# Patient Record
Sex: Female | Born: 1955 | Race: White | Hispanic: No | Marital: Married | Smoking: Never smoker
Health system: Southern US, Community
[De-identification: ages and names within clinical notes are randomized; demographics above are authoritative.]

## PROBLEM LIST (undated history)

## (undated) DIAGNOSIS — D1771 Benign lipomatous neoplasm of kidney: Secondary | ICD-10-CM

## (undated) DIAGNOSIS — F32A Depression, unspecified: Secondary | ICD-10-CM

## (undated) DIAGNOSIS — G4730 Sleep apnea, unspecified: Secondary | ICD-10-CM

## (undated) DIAGNOSIS — F419 Anxiety disorder, unspecified: Secondary | ICD-10-CM

## (undated) DIAGNOSIS — E039 Hypothyroidism, unspecified: Secondary | ICD-10-CM

## (undated) DIAGNOSIS — T753XXA Motion sickness, initial encounter: Secondary | ICD-10-CM

## (undated) DIAGNOSIS — I341 Nonrheumatic mitral (valve) prolapse: Secondary | ICD-10-CM

## (undated) DIAGNOSIS — D3001 Benign neoplasm of right kidney: Secondary | ICD-10-CM

## (undated) HISTORY — PX: BREAST CYST ASPIRATION: SHX578

## (undated) HISTORY — PX: TONSILLECTOMY: SUR1361

---

## 1986-12-11 HISTORY — PX: TUBAL LIGATION: SHX77

## 2004-12-11 HISTORY — PX: APPENDECTOMY: SHX54

## 2006-05-28 ENCOUNTER — Ambulatory Visit: Payer: Self-pay | Admitting: General Surgery

## 2007-02-20 ENCOUNTER — Ambulatory Visit: Payer: Self-pay | Admitting: Gastroenterology

## 2007-03-07 ENCOUNTER — Ambulatory Visit: Payer: Self-pay

## 2008-09-04 ENCOUNTER — Ambulatory Visit: Payer: Self-pay | Admitting: Family Medicine

## 2009-03-23 ENCOUNTER — Ambulatory Visit: Payer: Self-pay | Admitting: Obstetrics & Gynecology

## 2009-11-24 ENCOUNTER — Ambulatory Visit: Payer: Self-pay | Admitting: Family Medicine

## 2011-03-15 ENCOUNTER — Ambulatory Visit: Payer: Self-pay

## 2012-04-06 ENCOUNTER — Emergency Department: Payer: Self-pay | Admitting: Internal Medicine

## 2012-08-20 ENCOUNTER — Ambulatory Visit: Payer: Self-pay | Admitting: Family Medicine

## 2014-08-06 ENCOUNTER — Ambulatory Visit: Payer: Self-pay | Admitting: Family Medicine

## 2015-04-12 ENCOUNTER — Ambulatory Visit
Admission: RE | Admit: 2015-04-12 | Discharge: 2015-04-12 | Disposition: A | Discharge status: Home / Self Care | Payer: BLUE CROSS/BLUE SHIELD | Source: Ambulatory Visit | Attending: *Deleted | Admitting: *Deleted

## 2015-04-12 ENCOUNTER — Other Ambulatory Visit: Payer: Self-pay | Admitting: *Deleted

## 2015-04-12 ENCOUNTER — Ambulatory Visit
Admission: RE | Admit: 2015-04-12 | Discharge: 2015-04-12 | Disposition: A | Discharge status: Home / Self Care | Payer: BLUE CROSS/BLUE SHIELD | Attending: Family Medicine | Admitting: Family Medicine

## 2015-04-12 DIAGNOSIS — R1013 Epigastric pain: Secondary | ICD-10-CM | POA: Insufficient documentation

## 2015-04-13 ENCOUNTER — Other Ambulatory Visit: Payer: Self-pay | Admitting: Family Medicine

## 2015-04-13 DIAGNOSIS — R1013 Epigastric pain: Secondary | ICD-10-CM

## 2015-04-14 ENCOUNTER — Ambulatory Visit
Admission: RE | Admit: 2015-04-14 | Discharge: 2015-04-14 | Disposition: A | Discharge status: Home / Self Care | Payer: BLUE CROSS/BLUE SHIELD | Source: Ambulatory Visit | Attending: Family Medicine | Admitting: Family Medicine

## 2015-04-14 ENCOUNTER — Ambulatory Visit: Payer: BLUE CROSS/BLUE SHIELD

## 2015-04-14 DIAGNOSIS — R109 Unspecified abdominal pain: Secondary | ICD-10-CM | POA: Diagnosis present

## 2015-04-14 DIAGNOSIS — R1013 Epigastric pain: Secondary | ICD-10-CM

## 2015-04-14 DIAGNOSIS — N289 Disorder of kidney and ureter, unspecified: Secondary | ICD-10-CM | POA: Diagnosis not present

## 2015-04-16 ENCOUNTER — Other Ambulatory Visit: Payer: Self-pay | Admitting: Family Medicine

## 2015-04-16 DIAGNOSIS — R9389 Abnormal findings on diagnostic imaging of other specified body structures: Secondary | ICD-10-CM

## 2015-04-23 ENCOUNTER — Ambulatory Visit: Payer: BLUE CROSS/BLUE SHIELD

## 2015-05-04 ENCOUNTER — Encounter: Payer: Self-pay | Admitting: *Deleted

## 2015-05-05 NOTE — Discharge Instructions (Signed)

## 2015-05-06 ENCOUNTER — Ambulatory Visit
Admission: RE | Admit: 2015-05-06 | Discharge: 2015-05-06 | Disposition: A | Discharge status: Home / Self Care | Payer: BLUE CROSS/BLUE SHIELD | Source: Ambulatory Visit | Attending: Gastroenterology | Admitting: Gastroenterology

## 2015-05-06 ENCOUNTER — Ambulatory Visit: Payer: BLUE CROSS/BLUE SHIELD

## 2015-05-06 ENCOUNTER — Other Ambulatory Visit: Payer: Self-pay | Admitting: Gastroenterology

## 2015-05-06 ENCOUNTER — Encounter
Admission: RE | Disposition: A | Discharge status: Home / Self Care | Payer: Self-pay | Source: Ambulatory Visit | Attending: Gastroenterology

## 2015-05-06 DIAGNOSIS — R634 Abnormal weight loss: Secondary | ICD-10-CM | POA: Insufficient documentation

## 2015-05-06 DIAGNOSIS — Z6829 Body mass index (BMI) 29.0-29.9, adult: Secondary | ICD-10-CM | POA: Diagnosis not present

## 2015-05-06 DIAGNOSIS — I341 Nonrheumatic mitral (valve) prolapse: Secondary | ICD-10-CM | POA: Diagnosis not present

## 2015-05-06 DIAGNOSIS — Z79899 Other long term (current) drug therapy: Secondary | ICD-10-CM | POA: Diagnosis not present

## 2015-05-06 DIAGNOSIS — E039 Hypothyroidism, unspecified: Secondary | ICD-10-CM | POA: Diagnosis not present

## 2015-05-06 DIAGNOSIS — K648 Other hemorrhoids: Secondary | ICD-10-CM | POA: Insufficient documentation

## 2015-05-06 DIAGNOSIS — F329 Major depressive disorder, single episode, unspecified: Secondary | ICD-10-CM | POA: Diagnosis not present

## 2015-05-06 DIAGNOSIS — F419 Anxiety disorder, unspecified: Secondary | ICD-10-CM | POA: Diagnosis not present

## 2015-05-06 DIAGNOSIS — K219 Gastro-esophageal reflux disease without esophagitis: Secondary | ICD-10-CM | POA: Insufficient documentation

## 2015-05-06 DIAGNOSIS — R1013 Epigastric pain: Secondary | ICD-10-CM | POA: Diagnosis not present

## 2015-05-06 HISTORY — DX: Benign lipomatous neoplasm of kidney: D17.71

## 2015-05-06 HISTORY — PX: ESOPHAGOGASTRODUODENOSCOPY: SHX5428

## 2015-05-06 HISTORY — DX: Nonrheumatic mitral (valve) prolapse: I34.1

## 2015-05-06 HISTORY — DX: Benign neoplasm of right kidney: D30.01

## 2015-05-06 HISTORY — DX: Motion sickness, initial encounter: T75.3XXA

## 2015-05-06 HISTORY — PX: COLONOSCOPY: SHX5424

## 2015-05-06 LAB — HM COLONOSCOPY

## 2015-05-06 SURGERY — COLONOSCOPY
Anesthesia: Monitor Anesthesia Care | Wound class: Contaminated

## 2015-05-06 MED ORDER — LACTATED RINGERS IV SOLN
INTRAVENOUS | Status: DC
  Administered 2015-05-06 (×2): via INTRAVENOUS

## 2015-05-06 MED ORDER — PROPOFOL 10 MG/ML IV BOLUS
INTRAVENOUS | Status: DC | PRN
  Administered 2015-05-06: 50 mg via INTRAVENOUS
  Administered 2015-05-06: 100 mg via INTRAVENOUS
  Administered 2015-05-06: 20 mg via INTRAVENOUS
  Administered 2015-05-06 (×2): 50 mg via INTRAVENOUS

## 2015-05-06 MED ORDER — GLYCOPYRROLATE 0.2 MG/ML IJ SOLN
INTRAMUSCULAR | Status: DC | PRN
  Administered 2015-05-06: 0.2 mg via INTRAVENOUS

## 2015-05-06 MED ORDER — STERILE WATER FOR IRRIGATION IR SOLN
Status: DC | PRN
  Administered 2015-05-06: 11:00:00

## 2015-05-06 SURGICAL SUPPLY — 38 items
BALLN DILATOR 10-12 8 (BALLOONS)
BALLN DILATOR 12-15 8 (BALLOONS)
BALLN DILATOR 15-18 8 (BALLOONS)
BALLN DILATOR CRE 0-12 8 (BALLOONS)
BALLN DILATOR ESOPH 8 10 CRE (MISCELLANEOUS) IMPLANT
BALLOON DILATOR 12-15 8 (BALLOONS) IMPLANT
BALLOON DILATOR 15-18 8 (BALLOONS) IMPLANT
BALLOON DILATOR CRE 0-12 8 (BALLOONS) IMPLANT
BLOCK BITE 60FR ADLT L/F GRN (MISCELLANEOUS) ×3 IMPLANT
CANISTER SUCT 1200ML W/VALVE (MISCELLANEOUS) ×3 IMPLANT
FCP ESCP3.2XJMB 240X2.8X (MISCELLANEOUS)
FORCEPS BIOP RAD 4 LRG CAP 4 (CUTTING FORCEPS) ×3 IMPLANT
FORCEPS BIOP RJ4 240 W/NDL (MISCELLANEOUS)
FORCEPS ESCP3.2XJMB 240X2.8X (MISCELLANEOUS) IMPLANT
GOWN CVR UNV OPN BCK APRN NK (MISCELLANEOUS) ×2 IMPLANT
GOWN ISOL THUMB LOOP REG UNIV (MISCELLANEOUS) ×4
HEMOCLIP INSTINCT (CLIP) IMPLANT
INJECTOR VARIJECT VIN23 (MISCELLANEOUS) IMPLANT
KIT CO2 TUBING (TUBING) ×3 IMPLANT
KIT DEFENDO VALVE AND CONN (KITS) IMPLANT
KIT ENDO PROCEDURE OLY (KITS) ×3 IMPLANT
LIGATOR MULTIBAND 6SHOOTER MBL (MISCELLANEOUS) IMPLANT
MARKER SPOT ENDO TATTOO 5ML (MISCELLANEOUS) IMPLANT
PAD GROUND ADULT SPLIT (MISCELLANEOUS) IMPLANT
SNARE SHORT THROW 13M SML OVAL (MISCELLANEOUS) IMPLANT
SNARE SHORT THROW 30M LRG OVAL (MISCELLANEOUS) IMPLANT
SPOT EX ENDOSCOPIC TATTOO (MISCELLANEOUS)
SUCTION POLY TRAP 4CHAMBER (MISCELLANEOUS) IMPLANT
SYR INFLATION 60ML (SYRINGE) IMPLANT
TRAP SUCTION POLY (MISCELLANEOUS) IMPLANT
TUBING CONN 6MMX3.1M (TUBING)
TUBING SUCTION CONN 0.25 STRL (TUBING) IMPLANT
UNDERPAD 30X60 958B10 (PK) (MISCELLANEOUS) IMPLANT
VALVE BIOPSY ENDO (VALVE) IMPLANT
VARIJECT INJECTOR VIN23 (MISCELLANEOUS)
WATER AUXILLARY (MISCELLANEOUS) IMPLANT
WATER STERILE IRR 500ML POUR (IV SOLUTION) ×3 IMPLANT
WIRE CRE 18-20MM 8CM F G (MISCELLANEOUS) IMPLANT

## 2015-05-06 NOTE — Op Note (Signed)
[facility] Patient Name: Barbara Underwood Procedure Date: 05/06/2015 11:05 AM MRN: [id number] Account #: [id number] Date of Birth: 1956-01-16 Admit Type: Outpatient Age: 59 Room: [facility] OR ROOM 01 Gender: Female Note Status: Finalized Procedure:         Colonoscopy Indications:       Epigastric abdominal pain, Weight loss Providers:         Lucilla Lame, MD Referring MD:      Kirstie Peri. Caryn Section, MD (Referring MD) Medicines:         Propofol per Anesthesia Complications:     No immediate complications. Procedure:         Pre-Anesthesia Assessment:                    - Prior to the procedure, a History and Physical was                     performed, and patient medications and allergies were                     reviewed. The patient's tolerance of previous anesthesia                     was also reviewed. The risks and benefits of the procedure                     and the sedation options and risks were discussed with the                     patient. All questions were answered, and informed consent                     was obtained. Prior Anticoagulants: The patient has taken                     no previous anticoagulant or antiplatelet agents. ASA                     Grade Assessment: II - A patient with mild systemic                     disease. After reviewing the risks and benefits, the                     patient was deemed in satisfactory condition to undergo                     the procedure.                    After obtaining informed consent, the colonoscope was                     passed under direct vision. Throughout the procedure, the                     patient's blood pressure, pulse, and oxygen saturations                     were monitored continuously. The Olympus PCF H180AL                     colonoscope (S#: [id number]) was introduced through the anus  and advanced to the the cecum, identified by appendiceal            orifice and ileocecal valve. The colonoscopy was performed                     without difficulty. The patient tolerated the procedure                     well. The quality of the bowel preparation was excellent. Findings:      The perianal and digital rectal examinations were normal.      Non-bleeding internal hemorrhoids were found during retroflexion. The       hemorrhoids were Grade I (internal hemorrhoids that do not prolapse). Impression:        - Non-bleeding internal hemorrhoids.                    - No specimens collected. Recommendation:    - Repeat colonoscopy in 10 years for screening unless any                     change in family history or lower GI problems. Procedure Code(s): --- Professional ---                    [phone], Colonoscopy, flexible; diagnostic, including                     collection of specimen(s) by brushing or washing, when                     performed (separate procedure) Diagnosis Code(s): --- Professional ---                    R10.13, Epigastric pain                    R63.4, Abnormal weight loss CPT copyright 2014 American Medical Association. All rights reserved. The codes documented in this report are preliminary and upon coder review may  be revised to meet current compliance requirements. Lucilla Lame, MD 05/06/2015 11:30:11 AM This report has been signed electronically. Number of Addenda: 0 Note Initiated On: 05/06/2015 11:05 AM Scope Withdrawal Time: 0 hours 7 minutes 30 seconds  Total Procedure Duration: 0 hours 10 minutes 6 seconds       [facility]

## 2015-05-06 NOTE — Op Note (Signed)
[facility] [place] Patient Name: Oliver Neuwirth Procedure Date: 05/06/2015 11:06 AM MRN: [id number] Account #: [id number] Date of Birth: [birth year] Admit Type: Outpatient Age: 59 Room: [facility] OR ROOM 01 Gender: Female Note Status: Finalized Procedure:         Upper GI endoscopy Indications:       Epigastric abdominal pain, Weight loss Providers:         Lucilla Lame, MD Referring MD:      Kirstie Peri. Caryn Section, MD (Referring MD) Medicines:         Propofol per Anesthesia Complications:     No immediate complications. Procedure:         Pre-Anesthesia Assessment:                    - Prior to the procedure, a History and Physical was                     performed, and patient medications and allergies were                     reviewed. The patient's tolerance of previous anesthesia                     was also reviewed. The risks and benefits of the procedure                     and the sedation options and risks were discussed with the                     patient. All questions were answered, and informed consent                     was obtained. Prior Anticoagulants: The patient has taken                     no previous anticoagulant or antiplatelet agents. ASA                     Grade Assessment: II - A patient with mild systemic                     disease. After reviewing the risks and benefits, the                     patient was deemed in satisfactory condition to undergo                     the procedure.                    After obtaining informed consent, the endoscope was passed                     under direct vision. Throughout the procedure, the                     patient's blood pressure, pulse, and oxygen saturations                     were monitored continuously. The Olympus GIF H180J                     endoscope (S#: [id number]) was introduced through the mouth,  and advanced to the second part of duodenum. The upper GI                 endoscopy was accomplished without difficulty. The patient                     tolerated the procedure well. Findings:      The examined esophagus was normal.      The entire examined stomach was normal. Biopsies were taken with a cold       forceps for histology.      The examined duodenum was normal. Impression:        - Normal esophagus.                    - Normal stomach. Biopsied.                    - Normal examined duodenum. Recommendation:    - Await pathology results. Procedure Code(s): --- Professional ---                    [id number], Esophagogastroduodenoscopy, flexible, transoral;                     with biopsy, single or multiple Diagnosis Code(s): --- Professional ---                    R10.13, Epigastric pain                    R63.4, Abnormal weight loss CPT copyright 2014 American Medical Association. All rights reserved. The codes documented in this report are preliminary and upon coder review may  be revised to meet current compliance requirements. Lucilla Lame, MD 05/06/2015 11:16:32 AM This report has been signed electronically. Number of Addenda: 0 Note Initiated On: 05/06/2015 11:06 AM Total Procedure Duration: 0 hours 3 minutes 53 seconds       [facility]

## 2015-05-06 NOTE — H&P (Signed)
[facility]  [address], [place], [place] [address] Phone: [phone] Fax : [phone]  Primary Care Physician:  Lelon Huh, MD Primary Gastroenterologist:  Dr. Allen Norris  Pre-Procedure History & Physical: HPI:  Barbara Underwood is a 59 y.o. female is here for an endoscopy and colonoscopy.   Past Medical History  Diagnosis Date  . Mitral valve prolapse     Told she had as child, now told VERY mild, no need for prophylaxis  . Angiomyolipoma of right kidney     stable  . Motion sickness     car rides    Past Surgical History  Procedure Laterality Date  . Tubal ligation  1988  . Appendectomy  2006    Dr Fleet Contras, [facility]    Prior to Admission medications   Medication Sig Start Date End Date Taking? Authorizing Provider  buPROPion (WELLBUTRIN XL) 150 MG 24 hr tablet Take 150 mg by mouth daily. AM   Yes Historical Provider, MD  Coenzyme Q10 (CO Q 10 PO) Take by mouth.   Yes Historical Provider, MD  hyoscyamine (LEVSIN SL) 0.125 MG SL tablet Place 0.125 mg under the tongue every 6 (six) hours as needed.   Yes Historical Provider, MD  levothyroxine (SYNTHROID, LEVOTHROID) 75 MCG tablet Take 75 mcg by mouth daily before breakfast.   Yes Historical Provider, MD  LORazepam (ATIVAN) 0.5 MG tablet Take 0.5 mg by mouth every 4 (four) hours as needed for anxiety.   Yes Historical Provider, MD  Multiple Vitamin (MULTIVITAMIN) capsule Take 1 capsule by mouth daily.   Yes Historical Provider, MD  omeprazole (PRILOSEC) 20 MG capsule Take 20 mg by mouth daily. AM   Yes Historical Provider, MD  ondansetron (ZOFRAN) 4 MG tablet Take 4 mg by mouth every 6 (six) hours as needed for nausea or vomiting.   Yes Historical Provider, MD  sucralfate (CARAFATE) 1 GM/10ML suspension Take 1 g by mouth 4 (four) times daily.   Yes Historical Provider, MD  traZODone (DESYREL) 50 MG tablet Take 50 mg by mouth at bedtime.   Yes Historical Provider, MD    Allergies as of 05/04/2015 - Review  Complete 05/04/2015  Allergen Reaction Noted  . Codeine Nausea And Vomiting 05/04/2015    History reviewed. No pertinent family history.  History   Social History  . Marital Status: Married    Spouse Name: N/A  . Number of Children: N/A  . Years of Education: N/A   Occupational History  . Not on file.   Social History Main Topics  . Smoking status: Never Smoker   . Smokeless tobacco: Not on file  . Alcohol Use: No  . Drug Use: Not on file  . Sexual Activity: Not on file   Other Topics Concern  . Not on file   Social History Narrative    Review of Systems: See HPI, otherwise negative ROS  Physical Exam: BP 127/63 mmHg  Pulse 70  Temp(Src) 97.9 F (36.6 C) (Temporal)  Resp 16  Ht 5\' 5"  (1.651 m)  Wt 175 lb (79.379 kg)  BMI 29.12 kg/m2  SpO2 100% General:   Alert,  pleasant and cooperative in NAD Head:  Normocephalic and atraumatic. Neck:  Supple; no masses or thyromegaly. Lungs:  Clear throughout to auscultation.    Heart:  Regular rate and rhythm. Abdomen:  Soft, nontender and nondistended. Normal bowel sounds, without guarding, and without rebound.   Neurologic:  Alert and  oriented x4;  grossly normal neurologically.  Impression/Plan: Barbara Critchley  Underwood is here for an endoscopy and colonoscopy to be performed for Epigastric pain and weight loss  Risks, benefits, limitations, and alternatives regarding  endoscopy and colonoscopy have been reviewed with the patient.  Questions have been answered.  All parties agreeable.   [facility], MD  05/06/2015, 11:02 AM

## 2015-05-06 NOTE — Anesthesia Preprocedure Evaluation (Signed)
Anesthesia Evaluation  Patient identified by MRN, date of birth, ID band Patient awake    Reviewed: Allergy & Precautions, NPO status , Patient's Chart, lab work & pertinent test results  Airway Mallampati: II  TM Distance: >3 FB Neck ROM: Full    Dental   Pulmonary    Pulmonary exam normal       Cardiovascular Normal cardiovascular exam    Neuro/Psych Anxiety Depression    GI/Hepatic GERD-  ,  Endo/Other  Hypothyroidism   Renal/GU Renal disease     Musculoskeletal   Abdominal   Peds  Hematology   Anesthesia Other Findings   Reproductive/Obstetrics                             Anesthesia Physical Anesthesia Plan  ASA: II  Anesthesia Plan: MAC   Post-op Pain Management:    Induction: Intravenous  Airway Management Planned: Nasal Cannula  Additional Equipment:   Intra-op Plan:   Post-operative Plan:   Informed Consent: I have reviewed the patients History and Physical, chart, labs and discussed the procedure including the risks, benefits and alternatives for the proposed anesthesia with the patient or authorized representative who has indicated his/her understanding and acceptance.     Plan Discussed with: CRNA  Anesthesia Plan Comments:         Anesthesia Quick Evaluation

## 2015-05-06 NOTE — Anesthesia Postprocedure Evaluation (Signed)
  Anesthesia Post-op Note  Patient: Barbara Underwood  Procedure(s) Performed: Procedure(s): COLONOSCOPY (N/A) ESOPHAGOGASTRODUODENOSCOPY (EGD) (N/A)  Anesthesia type:MAC  Patient location: PACU  Post pain: Pain level controlled  Post assessment: Post-op Vital signs reviewed, Patient's Cardiovascular Status Stable, Respiratory Function Stable, Patent Airway and No signs of Nausea or vomiting  Post vital signs: Reviewed and stable  Last Vitals:  Filed Vitals:   05/06/15 1145  BP: 82/66  Pulse: 80  Temp:   Resp: 16    Level of consciousness: awake, alert  and patient cooperative  Complications: No apparent anesthesia complications

## 2015-05-06 NOTE — Transfer of Care (Signed)
Immediate Anesthesia Transfer of Care Note  Patient: Barbara Underwood  Procedure(s) Performed: Procedure(s): COLONOSCOPY (N/A) ESOPHAGOGASTRODUODENOSCOPY (EGD) (N/A)  Patient Location: PACU  Anesthesia Type: MAC  Level of Consciousness: awake, alert  and patient cooperative  Airway and Oxygen Therapy: Patient Spontanous Breathing and Patient connected to supplemental oxygen  Post-op Assessment: Post-op Vital signs reviewed, Patient's Cardiovascular Status Stable, Respiratory Function Stable, Patent Airway and No signs of Nausea or vomiting  Post-op Vital Signs: Reviewed and stable  Complications: No apparent anesthesia complications

## 2015-05-06 NOTE — Anesthesia Procedure Notes (Signed)
Procedure Name: MAC Performed by: Gailene Youkhana Pre-anesthesia Checklist: Patient identified, Emergency Drugs available, Suction available, Patient being monitored and Timeout performed Patient Re-evaluated:Patient Re-evaluated prior to inductionOxygen Delivery Method: Nasal cannula Preoxygenation: Pre-oxygenation with 100% oxygen       

## 2015-05-11 ENCOUNTER — Encounter: Payer: Self-pay | Admitting: Gastroenterology

## 2015-06-25 ENCOUNTER — Ambulatory Visit (INDEPENDENT_AMBULATORY_CARE_PROVIDER_SITE_OTHER): Payer: BLUE CROSS/BLUE SHIELD | Admitting: Family Medicine

## 2015-06-25 ENCOUNTER — Encounter: Payer: Self-pay | Admitting: Family Medicine

## 2015-06-25 VITALS — BP 102/64 | HR 80 | Temp 99.1°F | Resp 16

## 2015-06-25 DIAGNOSIS — J069 Acute upper respiratory infection, unspecified: Secondary | ICD-10-CM | POA: Diagnosis not present

## 2015-06-25 NOTE — Patient Instructions (Signed)
Discussed use of Mucinex D for congestion, Delsym for cough, and Benadryl for postnasal drainage at night.

## 2015-06-25 NOTE — Progress Notes (Signed)
Subjective:     Patient ID: Barbara Underwood, female   DOB: 05-29-1956, 59 y.o.   MRN: [id number]  HPI  Chief Complaint  Patient presents with  . Cough    patient comes in office today with complaints of cough and sore throat since Sunday. Patient describes cough as dry and painful, associated symptoms include cracking in ear.      Review of Systems  Constitutional: Negative for fever and chills.       Objective:   Physical Exam  Constitutional: She appears well-developed and well-nourished. No distress.  Ears: T.M's intact without inflammation Throat: no tonsillar enlargement or exudate (tonsils absent) Neck: no cervical adenopathy with mild right anterior cervical discomfort. Lungs: clear     Assessment:    1. Upper respiratory infection    Plan:    Discussed use of Mucinex D for congestion, Delsym for cough, and Benadryl for postnasal drainage. Hold trazodone at night if using Benadryl.

## 2015-06-29 ENCOUNTER — Telehealth: Payer: Self-pay | Admitting: Family Medicine

## 2015-06-29 NOTE — Telephone Encounter (Signed)
Her thyroid functions were normal and do not need to be checked again anytime soon. The only reason she was dehydrated was due to the GI problems she was having at the time. Is she is still having GI problems then it would be a good idea to recheck a renal panel. Thanks.

## 2015-06-29 NOTE — Telephone Encounter (Signed)
Patient had TSH checked on 04/30/15 and it was 2.990 (normal). Patient was also slightly dehydrated, and you  recommended  that she increase her fluid intake. Ok to recheck TSH level? And would you like renal panel as well? Please advise. Thanks!

## 2015-06-29 NOTE — Telephone Encounter (Signed)
Advised patient that she does not need to have TSH repeated. She reports that her GI symptoms have resolved. So we do not need to have labs drawn.

## 2015-06-29 NOTE — Telephone Encounter (Signed)
Pt is requesting a lab slip to have thyroid rechecked.  CB#[phone]

## 2015-06-30 ENCOUNTER — Other Ambulatory Visit: Payer: Self-pay

## 2015-06-30 DIAGNOSIS — F419 Anxiety disorder, unspecified: Secondary | ICD-10-CM

## 2015-06-30 DIAGNOSIS — D369 Benign neoplasm, unspecified site: Secondary | ICD-10-CM | POA: Insufficient documentation

## 2015-06-30 DIAGNOSIS — E039 Hypothyroidism, unspecified: Secondary | ICD-10-CM | POA: Insufficient documentation

## 2015-06-30 DIAGNOSIS — G43909 Migraine, unspecified, not intractable, without status migrainosus: Secondary | ICD-10-CM | POA: Insufficient documentation

## 2015-06-30 DIAGNOSIS — E669 Obesity, unspecified: Secondary | ICD-10-CM | POA: Insufficient documentation

## 2015-06-30 DIAGNOSIS — N2889 Other specified disorders of kidney and ureter: Secondary | ICD-10-CM | POA: Insufficient documentation

## 2015-06-30 DIAGNOSIS — I341 Nonrheumatic mitral (valve) prolapse: Secondary | ICD-10-CM | POA: Insufficient documentation

## 2015-06-30 DIAGNOSIS — F329 Major depressive disorder, single episode, unspecified: Secondary | ICD-10-CM | POA: Insufficient documentation

## 2015-07-01 ENCOUNTER — Ambulatory Visit (INDEPENDENT_AMBULATORY_CARE_PROVIDER_SITE_OTHER): Payer: BLUE CROSS/BLUE SHIELD | Admitting: Family Medicine

## 2015-07-01 ENCOUNTER — Encounter: Payer: Self-pay | Admitting: Family Medicine

## 2015-07-01 VITALS — BP 120/56 | HR 98 | Temp 98.2°F | Resp 16

## 2015-07-01 DIAGNOSIS — H6982 Other specified disorders of Eustachian tube, left ear: Secondary | ICD-10-CM

## 2015-07-01 MED ORDER — MOMETASONE FUROATE 50 MCG/ACT NA SUSP: 2.0000 | Freq: Every day | NASAL | Status: DC

## 2015-07-01 NOTE — Progress Notes (Signed)
Subjective:     Patient ID: Barbara Underwood, female   DOB: 05/23/1956, 59 y.o.   MRN: [id number]  HPI  Chief Complaint  Patient presents with  . Ear Pain    patient comes in office today with concerns of ear pain x 7 days, patient states that she feels dizzy,off balance when walking and decreased hearing. Patient states that ear has been popping but denies any discharge   States cold sx have improved from prior office visit. She is left with left sided ear "crackling" along with sx above.   Review of Systems  Constitutional: Negative for fever and chills.       Objective:   Physical Exam  Constitutional: She appears well-developed and well-nourished. No distress.  HENT:  Right Ear: Tympanic membrane normal.  Left Ear: Tympanic membrane normal.       Assessment:    1. Eustachian tube dysfunction, left - mometasone (NASONEX) 50 MCG/ACT nasal spray; Place 2 sprays into the nose daily.  Dispense: 17 g; Refill: 0    Plan:    Continue Sudafed PE otc.

## 2015-07-01 NOTE — Patient Instructions (Signed)
Continue Sudafed PE.

## 2015-07-28 ENCOUNTER — Other Ambulatory Visit: Payer: Self-pay

## 2015-07-28 DIAGNOSIS — N289 Disorder of kidney and ureter, unspecified: Secondary | ICD-10-CM

## 2015-09-16 ENCOUNTER — Other Ambulatory Visit: Payer: Self-pay | Admitting: Family Medicine

## 2015-09-29 ENCOUNTER — Ambulatory Visit (INDEPENDENT_AMBULATORY_CARE_PROVIDER_SITE_OTHER): Payer: BLUE CROSS/BLUE SHIELD | Admitting: Family Medicine

## 2015-09-29 ENCOUNTER — Encounter: Payer: Self-pay | Admitting: Family Medicine

## 2015-09-29 VITALS — BP 112/66 | HR 69 | Temp 97.4°F | Resp 16 | Wt 184.0 lb

## 2015-09-29 DIAGNOSIS — L299 Pruritus, unspecified: Secondary | ICD-10-CM

## 2015-09-29 MED ORDER — HYDROXYZINE HCL 25 MG PO TABS: ORAL_TABLET | ORAL | Status: DC

## 2015-09-29 NOTE — Progress Notes (Signed)
Subjective:     Patient ID: Essance Gatti, female   DOB: 01/25/1956, 59 y.o.   MRN: [id number]  HPI  Chief Complaint  Patient presents with  . Rash    Patient comes in office today with complaints of itching of the skin. Patient reports that itching began at waist line and now has radiated to scalp of head. Patient denies any change to medication, detergent, soap or perfume or any new food products. Patient has taking otc Eucerin, Benadryl, Lubriderm anti itch lotion.   Believes the itching started after getting the flu shot 10/11. Denies prior reaction to influenza vaccine. States her anxiety is well controlled with medication with no new stressful events in her life.   Review of Systems  Respiratory: Negative for shortness of breath.        Objective:   Physical Exam  Constitutional: She appears well-developed and well-nourished. No distress.  Skin:  No rash noted on her back except excoriation from scratching.       Assessment:    1. Itching - hydrOXYzine (ATARAX/VISTARIL) 25 MG tablet; Take one every 6 hours as needed for itching.  Dispense: 20 tablet; Refill: 0 - Comprehensive metabolic panel    Plan:    Add Claritin.

## 2015-09-29 NOTE — Patient Instructions (Signed)
Try one Claritin daily

## 2015-09-30 ENCOUNTER — Telehealth: Payer: Self-pay

## 2015-09-30 LAB — COMPREHENSIVE METABOLIC PANEL
A/G RATIO: 2 (ref 1.1–2.5)
ALK PHOS: 91 IU/L (ref 39–117)
ALT: 15 IU/L (ref 0–32)
AST: 16 IU/L (ref 0–40)
Albumin: 4.7 g/dL (ref 3.5–5.5)
BILIRUBIN TOTAL: 0.3 mg/dL (ref 0.0–1.2)
BUN / CREAT RATIO: 20 (ref 9–23)
BUN: 18 mg/dL (ref 6–24)
CHLORIDE: 99 mmol/L (ref 97–106)
CO2: 26 mmol/L (ref 18–29)
Calcium: 9.9 mg/dL (ref 8.7–10.2)
Creatinine, Ser: 0.88 mg/dL (ref 0.57–1.00)
GFR calc Af Amer: 83 mL/min/{1.73_m2} (ref >59)
GFR calc non Af Amer: 72 mL/min/{1.73_m2} (ref >59)
GLUCOSE: 98 mg/dL (ref 65–99)
Globulin, Total: 2.4 g/dL (ref 1.5–4.5)
POTASSIUM: 5.4 mmol/L — AB (ref 3.5–5.2)
Sodium: 141 mmol/L (ref 136–144)
Total Protein: 7.1 g/dL (ref 6.0–8.5)

## 2015-09-30 NOTE — Telephone Encounter (Signed)
Patient was advised of report she states that she has only used medication once at night and that itching has improved she will continue use and notify you next week if symptoms do not clear. KW

## 2015-09-30 NOTE — Telephone Encounter (Signed)
-----   Message from Carmon Ginsberg, [place] sent at 09/30/2015  7:43 AM EDT ----- Labs ok. Are the medications helping with your itching?

## 2016-01-11 ENCOUNTER — Encounter: Payer: Self-pay | Admitting: Family Medicine

## 2016-01-11 ENCOUNTER — Ambulatory Visit (INDEPENDENT_AMBULATORY_CARE_PROVIDER_SITE_OTHER): Payer: BLUE CROSS/BLUE SHIELD | Admitting: Family Medicine

## 2016-01-11 VITALS — BP 116/56 | HR 80 | Temp 97.9°F | Resp 16 | Wt 193.0 lb

## 2016-01-11 DIAGNOSIS — S20162A Insect bite (nonvenomous) of breast, left breast, initial encounter: Secondary | ICD-10-CM

## 2016-01-11 DIAGNOSIS — L089 Local infection of the skin and subcutaneous tissue, unspecified: Secondary | ICD-10-CM

## 2016-01-11 DIAGNOSIS — W57XXXA Bitten or stung by nonvenomous insect and other nonvenomous arthropods, initial encounter: Principal | ICD-10-CM

## 2016-01-11 NOTE — Patient Instructions (Signed)
Continue cleansing with soap and water and apply antibiotic cream with bandaid.

## 2016-01-11 NOTE — Progress Notes (Signed)
Subjective:     Patient ID: Barbara Underwood, female   DOB: 12/05/1956, 60 y.o.   MRN: [id number]  HPI  Chief Complaint  Patient presents with  . Rash    Patient comes in office today with concerns of a possible skin rash on her left breast. On or about 1/27 patient states that she felty like a pebble or food fell down her shirt she states later on that evening she had redness around her breast. Patient reports area had a red ring with a bulls eye, she states "bump" had popped and drained pus and blood. She states that she has tied applying triple antibiotic ointment cream.   States she felt a stabbing pain initially. She shook her bra out but did not see anything. When she was examining her breast later saw a dark area which erupted pus and blood. Since then wound has been steadily improving. No hx of MRSA. Review of Systems  Genitourinary:       Reports normal mammogram in the last week.       Objective:   Physical Exam  Constitutional: She appears well-developed and well-nourished. No distress.  Skin:  Left lateral breast with fading erythema and closed central wound. No tenderness or induration appreciated.       Assessment:    1. Insect bite of breast, infected, left, initial encounter    Plan:    Monitor for recurrence and C&S for MRSA. Continue cleansing with soap and water and applying abx ointment/bandaid.

## 2016-03-14 ENCOUNTER — Other Ambulatory Visit: Payer: Self-pay | Admitting: Family Medicine

## 2016-04-28 ENCOUNTER — Ambulatory Visit: Payer: BLUE CROSS/BLUE SHIELD | Admitting: Urology

## 2016-04-28 ENCOUNTER — Ambulatory Visit
Admission: RE | Admit: 2016-04-28 | Discharge: 2016-04-28 | Disposition: A | Discharge status: Home / Self Care | Payer: BLUE CROSS/BLUE SHIELD | Source: Ambulatory Visit | Attending: Urology | Admitting: Urology

## 2016-04-28 DIAGNOSIS — N289 Disorder of kidney and ureter, unspecified: Secondary | ICD-10-CM | POA: Insufficient documentation

## 2016-05-02 ENCOUNTER — Ambulatory Visit: Payer: BLUE CROSS/BLUE SHIELD | Admitting: Urology

## 2016-05-04 ENCOUNTER — Ambulatory Visit: Payer: BLUE CROSS/BLUE SHIELD | Admitting: Urology

## 2016-05-26 ENCOUNTER — Ambulatory Visit: Payer: BLUE CROSS/BLUE SHIELD | Admitting: Urology

## 2016-06-20 ENCOUNTER — Encounter: Payer: Self-pay | Admitting: Urology

## 2016-06-20 ENCOUNTER — Ambulatory Visit (INDEPENDENT_AMBULATORY_CARE_PROVIDER_SITE_OTHER): Payer: BLUE CROSS/BLUE SHIELD | Admitting: Urology

## 2016-06-20 VITALS — BP 114/74 | HR 72 | Ht 65.0 in | Wt 204.0 lb

## 2016-06-20 DIAGNOSIS — N393 Stress incontinence (female) (male): Secondary | ICD-10-CM

## 2016-06-20 DIAGNOSIS — N2889 Other specified disorders of kidney and ureter: Secondary | ICD-10-CM

## 2016-06-20 LAB — URINALYSIS, COMPLETE
Bilirubin, UA: NEGATIVE
GLUCOSE, UA: NEGATIVE
Ketones, UA: NEGATIVE
LEUKOCYTES UA: NEGATIVE
Nitrite, UA: NEGATIVE
PH UA: 5 (ref 5.0–7.5)
PROTEIN UA: NEGATIVE
Specific Gravity, UA: 1.02 (ref 1.005–1.030)
UUROB: 0.2 mg/dL (ref 0.2–1.0)

## 2016-06-20 LAB — MICROSCOPIC EXAMINATION: BACTERIA UA: NONE SEEN

## 2016-06-20 NOTE — Progress Notes (Signed)
06/20/2016 2:30 PM   Barbara Underwood 1956/10/05 [id number]  Referring provider: Birdie Sons, MD [address] [place], [place] [address]  Chief Complaint  Patient presents with  . Renal Mass    1 year follow up (Right)    HPI: 60-year-old female with an exophytic ~2 cm right upper pole renal mass treatment following annually with renal ultrasounds.  The lesion was first identified on CT scan in June 2007 at which time it was 11 mm. At that point in time, it was solid appearing with a small fat component and felt to possibly represent an angiomyolipoma. Over the interval of time, there has been slow interval growth of the lesion.  Prior to today, most recent ultrasound was 04/2015. Renal ultrasound 04/2016 shows no interval change or growth. This is essentially unchanged. Mass measures 2 x 1.5 x 1.9 cm which primarily hyperechoic with mixed heterogeneity.  She denies any flank pain or gross hematuria. She has no urinary complaints today other than increasing mild SUI over the past year.  She does not wear pads and is only minimally bothered by this.    She is been actively trying to lose weight. Her husband has had a few medical setbacks requiring surgery and hospitalization over the past year.   PMH: Past Medical History  Diagnosis Date  . Mitral valve prolapse     Told she had as child, now told VERY mild, no need for prophylaxis  . Angiomyolipoma of right kidney     stable  . Motion sickness     car rides    Surgical History: Past Surgical History  Procedure Laterality Date  . Tubal ligation  1988  . Appendectomy  2006    Dr Fleet Contras, [facility]  . Colonoscopy N/A 05/06/2015    Procedure: COLONOSCOPY;  Surgeon: Lucilla Lame, MD;  Location: [place];  Service: Gastroenterology;  Laterality: N/A;  . Esophagogastroduodenoscopy N/A 05/06/2015    Procedure: ESOPHAGOGASTRODUODENOSCOPY (EGD);  Surgeon: Lucilla Lame, MD;  Location: [place];  Service:  Gastroenterology;  Laterality: N/A;    Home Medications:    Medication List       This list is accurate as of: 06/20/16 11:59 PM.  Always use your most recent med list.               buPROPion 150 MG 24 hr tablet  Commonly known as:  WELLBUTRIN XL  Take 150 mg by mouth daily. AM     escitalopram 10 MG tablet  Commonly known as:  LEXAPRO  Take 10 mg by mouth every morning. Reported on 06/20/2016     FLUARIX QUADRIVALENT 0.5 ML injection  Generic drug:  Influenza vac split quadrivalent PF  Reported on 06/20/2016     levothyroxine 75 MCG tablet  Commonly known as:  SYNTHROID, LEVOTHROID  TAKE ONE TABLET BY MOUTH ONE TIME DAILY     multivitamin capsule  Take 1 capsule by mouth daily.     PROBIOTIC DAILY Caps  Take by mouth.     traZODone 50 MG tablet  Commonly known as:  DESYREL  Take 50 mg by mouth at bedtime.        Allergies:  Allergies  Allergen Reactions  . Prednisone Other (See Comments)    Makes patient feel violently ill   . Codeine Nausea And Vomiting    Also dizziness    Family History: History reviewed. No pertinent family history.  Social History:  reports that she has never smoked.  She does not have any smokeless tobacco history on file. She reports that she drinks alcohol. She reports that she does not use illicit drugs.  ROS: UROLOGY Frequent Urination?: Yes Hard to postpone urination?: No Burning/pain with urination?: No Get up at night to urinate?: Yes Leakage of urine?: No Urine stream starts and stops?: No Trouble starting stream?: No Do you have to strain to urinate?: No Blood in urine?: No Urinary tract infection?: No Sexually transmitted disease?: No Injury to kidneys or bladder?: No Painful intercourse?: No Weak stream?: No Currently pregnant?: No Vaginal bleeding?: No Last menstrual period?: n  Gastrointestinal Nausea?: No Vomiting?: No Indigestion/heartburn?: No Diarrhea?: No Constipation?:  No  Constitutional Fever: No Night sweats?: No Weight loss?: No Fatigue?: No  Skin Skin rash/lesions?: No Itching?: No  Eyes Blurred vision?: No Double vision?: No  Ears/Nose/Throat Sore throat?: No Sinus problems?: No  Hematologic/Lymphatic Swollen glands?: No Easy bruising?: No  Cardiovascular Leg swelling?: No Chest pain?: No  Respiratory Cough?: No Shortness of breath?: No  Endocrine Excessive thirst?: No  Musculoskeletal Back pain?: Yes Joint pain?: No  Neurological Headaches?: No Dizziness?: No  Psychologic Depression?: No Anxiety?: Yes  Physical Exam: BP 114/74 mmHg  Pulse 72  Ht 5\' 5"  (1.651 m)  Wt 204 lb (92.534 kg)  BMI 33.95 kg/m2  Constitutional:  Alert and oriented, No acute distress. HEENT: [place] AT, moist mucus membranes.  Trachea midline, no masses. Cardiovascular: No clubbing, cyanosis, or edema. Respiratory: Normal respiratory effort, no increased work of breathing. GI: Abdomen is soft, nontender, nondistended, no abdominal masses GU: No CVA tenderness.  Skin: No rashes, bruises or suspicious lesions. Neurologic: Grossly intact, no focal deficits, moving all 4 extremities. Psychiatric: Normal mood and affect.  Laboratory Data: Lab Results  Component Value Date   CREATININE 0.88 09/29/2015     Urinalysis Results for orders placed or performed in visit on 06/20/16  Microscopic Examination  Result Value Ref Range   WBC, UA 0-5 0 -  5 /hpf   RBC, UA 0-2 0 -  2 /hpf   Epithelial Cells (non renal) 0-10 0 - 10 /hpf   Mucus, UA Present (A) Not Estab.   Bacteria, UA None seen None seen/Few  Urinalysis, Complete  Result Value Ref Range   Specific Gravity, UA 1.020 1.005 - 1.030   pH, UA 5.0 5.0 - 7.5   Color, UA Yellow Yellow   Appearance Ur Clear Clear   Leukocytes, UA Negative Negative   Protein, UA Negative Negative/Trace   Glucose, UA Negative Negative   Ketones, UA Negative Negative   RBC, UA Trace (A) Negative    Bilirubin, UA Negative Negative   Urobilinogen, Ur 0.2 0.2 - 1.0 mg/dL   Nitrite, UA Negative Negative   Microscopic Examination See below:     Pertinent Imaging:    Study Result     CLINICAL DATA: Followup for a right renal mass.  EXAM: RENAL / URINARY TRACT ULTRASOUND COMPLETE  COMPARISON: Ultrasound, 04/14/2015. CT, 05/28/2006.  FINDINGS: Right Kidney:  Length: 10.6 cm. Normal parenchymal echogenicity. Exophytic upper pole, mixed echogenicity but primarily hyperechoic, mass measures 2 x 1.5 x 1.9 cm, unchanged from the prior exam. No new renal masses, no stones and no hydronephrosis.  Left Kidney:  Length: 11.4 cm. Echogenicity within normal limits. No mass or hydronephrosis visualized.  Bladder:  Appears normal for degree of bladder distention.  IMPRESSION: 1. Primarily hyperechoic mass arising from the upper pole of the right kidney is without change from the  prior ultrasound. Patient had a CT scan from June 2007. On that study, there is an 11 mm solid-appearing mildly mass with 1 small component of fat and average Hounsfield units of 70 arising from the upper pole the right kidney consistent with the current mass seen sonographically. This indicates a slow progressive increase in size. Followup MRI of the kidneys with and without contrast for further assessment is recommended. 2. No other abnormalities.   Electronically Signed  By: Lajean Manes M.D.  On: 04/28/2016 16:16   RUS reviewed today  Assessment & Plan:    1. Right renal mass Stable 2.0 cm right renal mass, increased from 11 mm over 10 year period, most likely consistent with a benign lesion with slow growth I have recommended continued annual renal ultrasound for surveillance We discussed that if this truly does represent low-grade malignancy, risk of metastatic disease under 3 cm is less than 5%  2. Urinary, incontinence, stress female Recommended stress urinary  incontinence and weight loss for minor SUI   Return in about 1 year (around 06/20/2017) for f/u RUS.  Hollice Espy, MD  [facility] [address] [place], [place] [address] [phone]

## 2017-03-30 ENCOUNTER — Other Ambulatory Visit: Payer: Self-pay | Admitting: Family Medicine

## 2017-06-11 ENCOUNTER — Ambulatory Visit: Payer: BLUE CROSS/BLUE SHIELD

## 2017-06-18 ENCOUNTER — Ambulatory Visit: Payer: BLUE CROSS/BLUE SHIELD

## 2017-06-19 ENCOUNTER — Ambulatory Visit
Admission: RE | Admit: 2017-06-19 | Discharge: 2017-06-19 | Disposition: A | Discharge status: Home / Self Care | Payer: PRIVATE HEALTH INSURANCE | Source: Ambulatory Visit | Attending: Urology | Admitting: Urology

## 2017-06-19 DIAGNOSIS — N2889 Other specified disorders of kidney and ureter: Secondary | ICD-10-CM | POA: Insufficient documentation

## 2017-06-20 ENCOUNTER — Ambulatory Visit (INDEPENDENT_AMBULATORY_CARE_PROVIDER_SITE_OTHER): Payer: PRIVATE HEALTH INSURANCE | Admitting: Urology

## 2017-06-20 ENCOUNTER — Encounter: Payer: Self-pay | Admitting: Urology

## 2017-06-20 VITALS — BP 122/74 | HR 78 | Ht 65.0 in | Wt 207.7 lb

## 2017-06-20 DIAGNOSIS — N2889 Other specified disorders of kidney and ureter: Secondary | ICD-10-CM | POA: Diagnosis not present

## 2017-06-20 NOTE — Progress Notes (Signed)
06/20/2017 10:05 AM   Barbara Underwood [birth year] [id number]  Referring provider: Birdie Sons, [address] [place], [place] [address]  Chief Complaint  Patient presents with  . Follow-up    1 year Renal Mass  . Results    RUS    HPI: 61 year old female with an exophytic ~2 cm right upper pole renal mass treatment following annually with renal ultrasounds.  The lesion was first identified on CT scan in June 2007 at which time it was 11 mm. At that point in time, it was solid appearing with a small fat component and felt to possibly represent an angiomyolipoma. Over the interval of time, there has been slow interval growth of the lesion Therefore been followed more closely.   Renal ultrasound 04/2016 shows no interval change or growth. This is essentially unchanged from previous. Mass measureed 2 x 1.5 x 1.9 cm which primarily hyperechoic with mixed heterogeneity.  Today, renal ultrasound shows decreased size of the mass in question now measuring 1.2 x 1.5 x 1.2 cm.  She denies any flank pain or gross hematuria.   Over the past year, she's had no changes in her health other than weight gain. She's tried multiple things including Weight Watchers without success. She's recently joined a gym.  PMH: Past Medical History:  Diagnosis Date  . Angiomyolipoma of right kidney    stable  . Mitral valve prolapse    Told she had as child, now told VERY mild, no need for prophylaxis  . Motion sickness    car rides    Surgical History: Past Surgical History:  Procedure Laterality Date  . APPENDECTOMY  2006   Dr Fleet Contras, [facility]  . COLONOSCOPY N/A 05/06/2015   Procedure: COLONOSCOPY;  Surgeon: Lucilla Lame, MD;  Location: [facility];  Service: Gastroenterology;  Laterality: N/A;  . ESOPHAGOGASTRODUODENOSCOPY N/A 05/06/2015   Procedure: ESOPHAGOGASTRODUODENOSCOPY (EGD);  Surgeon: Lucilla Lame, MD;  Location: [place];  Service: Gastroenterology;   Laterality: N/A;  . TONSILLECTOMY    . TUBAL LIGATION  1988    Home Medications:  Allergies as of 06/20/2017      Reactions   Prednisone Other (See Comments)   Makes patient feel violently ill    Codeine Nausea And Vomiting   Also dizziness      Medication List       Accurate as of 06/20/17 10:05 AM. Always use your most recent med list.          ALPRAZolam 1 MG tablet Commonly known as:  XANAX alprazolam 1 mg tablet   buPROPion 150 MG 24 hr tablet Commonly known as:  WELLBUTRIN XL bupropion HCl XL 150 mg 24 hr tablet, extended release   CARAFATE 1 GM/10ML suspension Generic drug:  sucralfate Carafate 100 mg/mL oral suspension   ciprofloxacin 500 MG tablet Commonly known as:  CIPRO ciprofloxacin 500 mg tablet   escitalopram 10 MG tablet Commonly known as:  LEXAPRO Take 10 mg by mouth every morning. Reported on 06/20/2016   FLUARIX QUADRIVALENT 0.5 ML injection Generic drug:  Influenza vac split quadrivalent PF Reported on 06/20/2016   hydrOXYzine 25 MG tablet Commonly known as:  ATARAX/VISTARIL hydroxyzine HCl 25 mg tablet   HYOSCYAMINE PO hyoscyamine 0.125 mg sublingual tablet   levothyroxine 75 MCG tablet Commonly known as:  SYNTHROID, LEVOTHROID levothyroxine 75 mcg tablet  TAKE ONE TABLET BY MOUTH ONE TIME DAILY   multivitamin capsule Take 1 capsule by mouth daily.   NASONEX 50 MCG/ACT  nasal spray Generic drug:  mometasone Nasonex 50 mcg/actuation Spray   omeprazole 20 MG capsule Commonly known as:  PRILOSEC omeprazole 20 mg capsule,delayed release   ondansetron 4 MG disintegrating tablet Commonly known as:  ZOFRAN-ODT Take 4 mg by mouth.   ondansetron 4 MG tablet Commonly known as:  ZOFRAN ondansetron HCl 4 mg tablet   PROBIOTIC DAILY Caps Take by mouth.   traZODone 50 MG tablet Commonly known as:  DESYREL Take 50 mg by mouth at bedtime.       Allergies:  Allergies  Allergen Reactions  . Prednisone Other (See Comments)     Makes patient feel violently ill   . Codeine Nausea And Vomiting    Also dizziness    Family History: Family History  Problem Relation Age of Onset  . Kidney cancer Neg Hx   . Bladder Cancer Neg Hx     Social History:  reports that she has never smoked. She has never used smokeless tobacco. She reports that she drinks alcohol. She reports that she does not use drugs.  ROS: UROLOGY Frequent Urination?: Yes Hard to postpone urination?: No Burning/pain with urination?: No Get up at night to urinate?: Yes Leakage of urine?: No Urine stream starts and stops?: No Trouble starting stream?: No Do you have to strain to urinate?: No Blood in urine?: No Urinary tract infection?: No Sexually transmitted disease?: No Injury to kidneys or bladder?: No Painful intercourse?: No Weak stream?: No Currently pregnant?: No Vaginal bleeding?: No Last menstrual period?: n  Gastrointestinal Nausea?: No Vomiting?: No Indigestion/heartburn?: No Diarrhea?: No Constipation?: No  Constitutional Fever: No Night sweats?: No Weight loss?: No Fatigue?: No  Skin Skin rash/lesions?: No Itching?: No  Eyes Blurred vision?: No Double vision?: No  Ears/Nose/Throat Sore throat?: No Sinus problems?: No  Hematologic/Lymphatic Swollen glands?: No Easy bruising?: No  Cardiovascular Leg swelling?: No Chest pain?: No  Respiratory Cough?: No Shortness of breath?: No  Endocrine Excessive thirst?: No  Musculoskeletal Back pain?: Yes Joint pain?: Yes  Neurological Headaches?: No Dizziness?: No  Psychologic Depression?: Yes Anxiety?: Yes  Physical Exam: BP 122/74   Pulse 78   Ht 5\' 5"  (1.651 m)   Wt 207 lb 11.2 oz (94.2 kg)   BMI 34.56 kg/m   Constitutional:  Alert and oriented, No acute distress. HEENT: [place] AT, moist mucus membranes.  Trachea midline, no masses. Cardiovascular: No clubbing, cyanosis, or edema. Respiratory: Normal respiratory effort, no increased work of  breathing. Skin: No rashes, bruises or suspicious lesions. Neurologic: Grossly intact, no focal deficits, moving all 4 extremities. Psychiatric: Normal mood and affect.  Laboratory Data: N/a- no new labs  Urinalysis N/a  Pertinent Imaging: CLINICAL DATA:  Right renal mass  EXAM: RENAL / URINARY TRACT ULTRASOUND COMPLETE  COMPARISON:  Apr 28, 2016 and Apr 14, 2015  FINDINGS: Right Kidney:  Length: 11.5 cm. Echogenicity and renal cortical thickness are within normal limits. No perinephric fluid or hydronephrosis visualized. The hyperechoic mass arising in the upper region of the right kidney currently measures 1.2 x 1.5 x 1.3 cm, slightly smaller compared to prior studies. No new renal mass evident. No sonographically demonstrable calculus or ureterectasis.  Left Kidney:  Length: 11.2 cm. Echogenicity and renal cortical thickness are within normal limits. No mass, perinephric fluid, or hydronephrosis visualized. No sonographically demonstrable calculus or ureterectasis.  Bladder:  Appears normal for degree of bladder distention.  IMPRESSION: Previously noted predominantly hyperechoic mass arising from the upper portion of the right kidney appears slightly  smaller compared to most recent sonographic evaluations. This mass most likely represents an angiomyolipoma. No new lesion. Study otherwise unremarkable.   Electronically Signed   By: Lowella Grip III M.D.   On: 06/19/2017 16:17   RUS reviewed today  Assessment & Plan:    1. Right renal mass Slight interval decrease in size of right renal mass previous and measuring 2 cm now 1.5 cm right renal mass most likely consistent with a benign lesion with slow growth  Based on overall appearance of the lesion on multiple previous imaging modalities and slow interval growth, possible involution, this is all consistent and suspicious for angiomyolipoma which is a benign etiology. I have recommended  decreasing the frequency of surveillance, will recheck in 3 years which is agreeable with the patient. All questions were answered.   Return in about 3 years (around 06/20/2020) for renal ultrasound.  Hollice Espy, MD  [facility] [address] [place], [place] [address] [phone]

## 2017-08-02 ENCOUNTER — Ambulatory Visit: Payer: PRIVATE HEALTH INSURANCE | Admitting: Family Medicine

## 2017-08-08 ENCOUNTER — Other Ambulatory Visit: Payer: Self-pay | Admitting: Physician Assistant

## 2017-08-08 ENCOUNTER — Telehealth: Payer: Self-pay

## 2017-08-08 ENCOUNTER — Ambulatory Visit (INDEPENDENT_AMBULATORY_CARE_PROVIDER_SITE_OTHER): Payer: PRIVATE HEALTH INSURANCE | Admitting: Physician Assistant

## 2017-08-08 ENCOUNTER — Ambulatory Visit
Admission: RE | Admit: 2017-08-08 | Discharge: 2017-08-08 | Disposition: A | Discharge status: Home / Self Care | Payer: PRIVATE HEALTH INSURANCE | Source: Ambulatory Visit | Attending: Physician Assistant | Admitting: Physician Assistant

## 2017-08-08 ENCOUNTER — Encounter: Payer: Self-pay | Admitting: Physician Assistant

## 2017-08-08 VITALS — BP 112/66 | HR 94 | Temp 98.2°F | Wt 211.8 lb

## 2017-08-08 DIAGNOSIS — M546 Pain in thoracic spine: Secondary | ICD-10-CM | POA: Diagnosis not present

## 2017-08-08 DIAGNOSIS — R05 Cough: Secondary | ICD-10-CM

## 2017-08-08 DIAGNOSIS — R918 Other nonspecific abnormal finding of lung field: Secondary | ICD-10-CM | POA: Diagnosis not present

## 2017-08-08 DIAGNOSIS — J189 Pneumonia, unspecified organism: Secondary | ICD-10-CM

## 2017-08-08 DIAGNOSIS — J181 Lobar pneumonia, unspecified organism: Principal | ICD-10-CM

## 2017-08-08 DIAGNOSIS — R059 Cough, unspecified: Secondary | ICD-10-CM

## 2017-08-08 DIAGNOSIS — M549 Dorsalgia, unspecified: Secondary | ICD-10-CM | POA: Diagnosis present

## 2017-08-08 LAB — POCT URINALYSIS DIPSTICK
Bilirubin, UA: NEGATIVE
Blood, UA: NEGATIVE
Glucose, UA: NEGATIVE
Ketones, UA: NEGATIVE
Leukocytes, UA: NEGATIVE
Nitrite, UA: NEGATIVE
Protein, UA: NEGATIVE
Spec Grav, UA: >=1.03 — AB (ref 1.010–1.025)
Urobilinogen, UA: 0.2 E.U./dL
pH, UA: 5 (ref 5.0–8.0)

## 2017-08-08 MED ORDER — BENZONATATE 100 MG PO CAPS: 100.0000 mg | ORAL_CAPSULE | Freq: Three times a day (TID) | ORAL | 0 refills | Status: DC

## 2017-08-08 MED ORDER — DOXYCYCLINE HYCLATE 100 MG PO TABS: 100.0000 mg | ORAL_TABLET | Freq: Two times a day (BID) | ORAL | 0 refills | Status: AC

## 2017-08-08 NOTE — Patient Instructions (Signed)
Can take 400 mg ibuprofen 3 times daily, use heat.    Costochondritis Costochondritis is swelling and irritation (inflammation) of the tissue (cartilage) that connects your ribs to your breastbone (sternum). This causes pain in the front of your chest. Usually, the pain:  Starts gradually.  Is in more than one rib.  This condition usually goes away on its own over time. Follow these instructions at home:  Do not do anything that makes your pain worse.  If directed, put ice on the painful area: ? Put ice in a plastic bag. ? Place a towel between your skin and the bag. ? Leave the ice on for 20 minutes, 2-3 times a day.  If directed, put heat on the affected area as often as told by your doctor. Use the heat source that your doctor tells you to use, such as a moist heat pack or a heating pad. ? Place a towel between your skin and the heat source. ? Leave the heat on for 20-30 minutes. ? Take off the heat if your skin turns bright red. This is very important if you cannot feel pain, heat, or cold. You may have a greater risk of getting burned.  Take over-the-counter and prescription medicines only as told by your doctor.  Return to your normal activities as told by your doctor. Ask your doctor what activities are safe for you.  Keep all follow-up visits as told by your doctor. This is important. Contact a doctor if:  You have chills or a fever.  Your pain does not go away or it gets worse.  You have a cough that does not go away. Get help right away if:  You are short of breath. This information is not intended to replace advice given to you by your health care provider. Make sure you discuss any questions you have with your health care provider. Document Released: 05/15/2008 Document Revised: 06/16/2016 Document Reviewed: 03/22/2016 Elsevier Interactive Patient Education  [employer].

## 2017-08-08 NOTE — Telephone Encounter (Signed)
-----   Message from Trinna Post, [place] sent at 08/08/2017  2:54 PM EDT ----- CXR showed "increased lung markings" of the left upper lobe which can be either how the lung folds on itself or possibly early pneumonia. Though this isn't in the location of the patient's pain, we can still send in a trial of antibiotics and repeat the CXR in one month. I have sent in doxycycline 100 mg twice daily for one week and I will order a chest x ray for one month. She doesn't need a repeat office visit, just needs to get the cxr in one month at the outpatient imaging center. Please call with any questions.

## 2017-08-08 NOTE — Progress Notes (Signed)
Patient: Barbara Underwood Female    DOB: 1955/12/22   61 y.o.   MRN: [id number] Visit Date: 08/08/2017  Today's Provider: Trinna Post, PA-C   Chief Complaint  Patient presents with  . Back Pain   Subjective:      Barbara Underwood is a 61 y/o woman presenting today with cough and back pain. She says she had a cold three weeks ago, felt better from that, but now has a cough and back pain that has been ongoing for 4 days. She reports her cough is non productive. Denies fevers, chills, n/v. She does not smoke. She has back pain in the thoracic area that is constant and worsens when she moves or coughs. Denies immobility, active cancer, HRT, long periods immobility. Denies UTI symptoms.  Back Pain  This is a new problem. Episode onset: Sunday night. The problem occurs constantly. The problem has been gradually worsening since onset. The pain is present in the thoracic spine (right side). The quality of the pain is described as stabbing. The pain does not radiate. The pain is the same all the time. The symptoms are aggravated by standing. Chest pain: chest congestion and tightness. Started 3 weeks ago. (Cough) She has tried NSAIDs for the symptoms. The treatment provided no relief.      Previous Medications   BUPROPION (WELLBUTRIN XL) 150 MG 24 HR TABLET    bupropion HCl XL 150 mg 24 hr tablet, extended release   DULOXETINE (CYMBALTA) 60 MG CAPSULE    Take 60 mg by mouth every morning.   LEVOTHYROXINE (SYNTHROID, LEVOTHROID) 75 MCG TABLET    levothyroxine 75 mcg tablet  TAKE ONE TABLET BY MOUTH ONE TIME DAILY   MULTIPLE VITAMIN (MULTIVITAMIN) CAPSULE    Take 1 capsule by mouth daily.   TRAZODONE (DESYREL) 50 MG TABLET    Take 100 mg by mouth at bedtime.     Review of Systems  Constitutional: Negative.   HENT: Positive for congestion.   Respiratory: Positive for cough.   Cardiovascular: Negative.  Chest pain: chest congestion and tightness. Started 3 weeks ago.  Musculoskeletal: Positive  for back pain.    Social History  Substance Use Topics  . Smoking status: Never Smoker  . Smokeless tobacco: Never Used  . Alcohol use 0.0 oz/week     Comment: rare   Objective:   BP 112/66 (BP Location: Left Arm, Patient Position: Sitting, Cuff Size: Normal)   Pulse 94   Temp 98.2 F (36.8 C) (Oral)   Wt 211 lb 12.8 oz (96.1 kg)   SpO2 98%   BMI 35.25 kg/m   Physical Exam  Constitutional: She is oriented to person, place, and time. She appears well-nourished.  HENT:  Right Ear: External ear normal.  Left Ear: External ear normal.  Mouth/Throat: Oropharynx is clear and moist. No oropharyngeal exudate.  Eyes: Conjunctivae are normal.  Neck: Neck supple.  Cardiovascular: Normal rate and regular rhythm.   Pulmonary/Chest: Effort normal and breath sounds normal.  Abdominal: Soft. Bowel sounds are normal. There is no CVA tenderness.  Musculoskeletal:       Thoracic back: She exhibits pain.  No spinous process tenderness but pain in right paraspinal muscles.  Lymphadenopathy:    She has no cervical adenopathy.  Neurological: She is alert and oriented to person, place, and time.  Skin: Skin is warm and dry.  Psychiatric: She has a normal mood and affect. Her behavior is normal.        Assessment &  Plan:     1. Cough  - benzonatate (TESSALON PERLES) 100 MG capsule; Take 1 capsule (100 mg total) by mouth 3 (three) times daily.  Dispense: 21 capsule; Refill: 0 - DG Chest 2 View; Future  2. Acute right-sided thoracic back pain  Seems musculoskeletal. UA in office negative. No adverse sounds on exam, but will get CXR to check for continued infectious process. Can use heat to area and consistent NSAIDS, ibuprofen 400 mg TID.  - benzonatate (TESSALON PERLES) 100 MG capsule; Take 1 capsule (100 mg total) by mouth 3 (three) times daily.  Dispense: 21 capsule; Refill: 0 - DG Chest 2 View; Future - POCT Urinalysis Dipstick  The entirety of the information documented in the  History of Present Illness, Review of Systems and Physical Exam were personally obtained by me. Portions of this information were initially documented by [facility] and reviewed by me for thoroughness and accuracy.      Follow up: Return if symptoms worsen or fail to improve.

## 2017-08-08 NOTE — Telephone Encounter (Signed)
Patient advised. She verbalized understanding.  

## 2018-01-14 ENCOUNTER — Encounter: Payer: Self-pay | Admitting: Family Medicine

## 2018-01-14 ENCOUNTER — Ambulatory Visit: Payer: PRIVATE HEALTH INSURANCE | Admitting: Family Medicine

## 2018-01-14 VITALS — BP 112/60 | HR 76 | Temp 98.7°F | Resp 16 | Wt 222.0 lb

## 2018-01-14 DIAGNOSIS — J029 Acute pharyngitis, unspecified: Secondary | ICD-10-CM | POA: Diagnosis not present

## 2018-01-14 LAB — POCT RAPID STREP A (OFFICE): Rapid Strep A Screen: NEGATIVE

## 2018-01-14 LAB — POC INFLUENZA A&B (BINAX/QUICKVUE)
INFLUENZA A, POC: NEGATIVE
INFLUENZA B, POC: NEGATIVE

## 2018-01-14 NOTE — Patient Instructions (Addendum)
Discussed use of Mucinex D for congestion, Delsym for cough, and Benadryl for postnasal drainage. Hold trazodone if using Benadryl at night.

## 2018-01-14 NOTE — Progress Notes (Signed)
Subjective:     Patient ID: Barbara Underwood, female   DOB: August 06, 1956, 62 y.o.   MRN: [id number] Chief Complaint  Patient presents with  . Sore Throat    Headache, sore throat started early this morning.    HPI Reports granddaughter had strep 3 weeks ago. +flu shot. Reports minimal sinus congestion without cough.  Review of Systems     Objective:   Physical Exam  Constitutional: She appears well-developed and well-nourished. No distress.  Ears: T.M's intact without inflammation Sinuses: non-tender Throat: tonsils absent with posterior pharyngeal erythema Neck: no cervical adenopathy with mild left anterior cervical tenderness Lungs: clear     Assessment:    1. Pharyngitis, unspecified etiology: suspect early viral syndrome - POCT rapid strep A - POC Influenza A&B(BINAX/QUICKVUE)    Plan:    Discussed use of Mucinex D for congestion, Delsym for cough, and Benadryl for postnasal drainage. Hold trazodone if using Benadryl at night.

## 2018-01-28 ENCOUNTER — Other Ambulatory Visit: Payer: Self-pay | Admitting: Orthopedic Surgery

## 2018-01-28 DIAGNOSIS — M25562 Pain in left knee: Secondary | ICD-10-CM

## 2018-02-07 ENCOUNTER — Ambulatory Visit
Admission: RE | Admit: 2018-02-07 | Discharge: 2018-02-07 | Disposition: A | Discharge status: Home / Self Care | Payer: PRIVATE HEALTH INSURANCE | Source: Ambulatory Visit | Attending: Orthopedic Surgery | Admitting: Orthopedic Surgery

## 2018-02-07 DIAGNOSIS — M1712 Unilateral primary osteoarthritis, left knee: Secondary | ICD-10-CM | POA: Diagnosis not present

## 2018-02-07 DIAGNOSIS — M25562 Pain in left knee: Secondary | ICD-10-CM | POA: Diagnosis not present

## 2018-03-18 ENCOUNTER — Telehealth: Payer: Self-pay | Admitting: Family Medicine

## 2018-03-18 NOTE — Telephone Encounter (Signed)
Faxed OV 01/11/16 to Dr Cephus Shelling on 07/10/17 - [facility]

## 2018-05-13 ENCOUNTER — Other Ambulatory Visit: Payer: Self-pay | Admitting: Family Medicine

## 2018-05-30 ENCOUNTER — Encounter: Payer: Self-pay | Admitting: Family Medicine

## 2018-05-30 ENCOUNTER — Ambulatory Visit: Payer: PRIVATE HEALTH INSURANCE | Admitting: Family Medicine

## 2018-05-30 VITALS — BP 110/80 | HR 79 | Temp 98.5°F | Resp 16

## 2018-05-30 DIAGNOSIS — G44309 Post-traumatic headache, unspecified, not intractable: Secondary | ICD-10-CM | POA: Diagnosis not present

## 2018-05-30 DIAGNOSIS — G44201 Tension-type headache, unspecified, intractable: Secondary | ICD-10-CM | POA: Diagnosis not present

## 2018-05-30 NOTE — Progress Notes (Addendum)
  Subjective:     Patient ID: Barbara Underwood, female   DOB: 1956-05-27, 62 y.o.   MRN: [id number] Chief Complaint  Patient presents with  . Fall    Patient reports that on 05/25/18 she was walking in her home when her shoe got caught in the floor, patient states that she tripped when her shoe got stuck and fell landing on her forehead. Patient states that she did not loos conciousness but states that she saw "stars".Patient states that she has had frequent headaches which feels like pressure around head. Patient denies nausea or vomiting.    HPI Reports persistent "vice-like" headache since she fell directly on her forehead onto a wood floor. Denies changes in mentation or vision and has been going to work. Currently on ASA for primary prevention.  Review of Systems     Objective:   Physical Exam  Constitutional: She appears well-developed and well-nourished. No distress.  HENT:  Head: Normocephalic and atraumatic.  No hemotympanum  Eyes: Pupils are equal, round, and reactive to light. EOM are normal.  Neurological: Coordination (finger to nose, heel to toe and Romberg negative) normal.  Grip strength 5/5 symmetrically Alert and oriented: D.O.B; president, date, counting backwards, and months of the year backward       Assessment:    1. Post-concussion headache  2. Intractable tension-type headache, unspecified chronicity pattern - CT Head Wo Contrast; Future    Plan:   Discussed putting her brain to rest and stopping ASA. Use Tylenol for pain. Further f/u pending CT scan results.

## 2018-05-30 NOTE — Patient Instructions (Addendum)
Please put your brain to rest: no driving, working, video games, etc. Use Tylenol for pain. Stop aspirin. We will call you about the CT scan.

## 2018-06-04 ENCOUNTER — Ambulatory Visit
Admission: RE | Admit: 2018-06-04 | Discharge: 2018-06-04 | Disposition: A | Discharge status: Home / Self Care | Payer: PRIVATE HEALTH INSURANCE | Source: Ambulatory Visit | Attending: Family Medicine | Admitting: Family Medicine

## 2018-06-04 DIAGNOSIS — G44021 Chronic cluster headache, intractable: Secondary | ICD-10-CM | POA: Diagnosis present

## 2018-06-04 DIAGNOSIS — G44201 Tension-type headache, unspecified, intractable: Secondary | ICD-10-CM

## 2018-06-05 ENCOUNTER — Telehealth: Payer: Self-pay

## 2018-06-05 NOTE — Telephone Encounter (Signed)
Patient called requesting results from CT.

## 2018-06-06 ENCOUNTER — Other Ambulatory Visit: Payer: Self-pay | Admitting: Family Medicine

## 2018-09-20 ENCOUNTER — Encounter: Payer: Self-pay | Admitting: Family Medicine

## 2018-09-20 ENCOUNTER — Ambulatory Visit: Payer: PRIVATE HEALTH INSURANCE | Admitting: Family Medicine

## 2018-09-20 VITALS — BP 120/66 | HR 86 | Temp 98.3°F | Resp 16 | Wt 209.6 lb

## 2018-09-20 DIAGNOSIS — J0110 Acute frontal sinusitis, unspecified: Secondary | ICD-10-CM | POA: Diagnosis not present

## 2018-09-20 MED ORDER — AMOXICILLIN-POT CLAVULANATE 875-125 MG PO TABS: 1.0000 | ORAL_TABLET | Freq: Two times a day (BID) | ORAL | 0 refills | Status: DC

## 2018-09-20 NOTE — Patient Instructions (Signed)
Discussed use of Mucinex D and Deslym for cough. Let me know if not improving.

## 2018-09-20 NOTE — Progress Notes (Signed)
  Subjective:     Patient ID: Barbara Underwood, female   DOB: 11/24/1956, 62 y.o.   MRN: [id number] Chief Complaint  Patient presents with  . Cough    Patient comes in office today with complaints of severe sinus pain for the past week and half. Patient reports productive cough with dark yellow sputum, chest congestion and ear pressure. Patient has taken otc Mucinex, Severe Cold and Cough syrup and Tylenol.    HPI Reports purulent PND and accompanying cough. Localizes sinus pressure behind her eyes.  Review of Systems     Objective:   Physical Exam  Constitutional: She appears well-developed and well-nourished. She has a sickly appearance.  Ears: T.M's intact without inflammation Sinuses: mild frontal sinus tenderness Throat: tonsils absent without erythema Neck: left anterior cervical tenderness Lungs: clear     Assessment:    1. Acute frontal sinusitis, recurrence not specified - amoxicillin-clavulanate (AUGMENTIN) 875-125 MG tablet; Take 1 tablet by mouth 2 (two) times daily.  Dispense: 20 tablet; Refill: 0    Plan:    Discussed use of Mucinex D and Delsym.

## 2018-10-03 ENCOUNTER — Other Ambulatory Visit: Payer: Self-pay | Admitting: Family Medicine

## 2019-02-21 ENCOUNTER — Other Ambulatory Visit: Payer: Self-pay | Admitting: Family Medicine

## 2019-05-27 ENCOUNTER — Other Ambulatory Visit: Payer: Self-pay | Admitting: Family Medicine

## 2019-08-19 ENCOUNTER — Other Ambulatory Visit: Payer: Self-pay | Admitting: Family Medicine

## 2019-08-19 NOTE — Telephone Encounter (Signed)
Called patient to remind her that it has been over a year since last follow up and office visit need to be scheduled first. Appointment made for 08/29/19. Please review request. Barbara Underwood

## 2019-08-22 ENCOUNTER — Ambulatory Visit: Payer: Self-pay | Admitting: Family Medicine

## 2019-08-29 ENCOUNTER — Ambulatory Visit (INDEPENDENT_AMBULATORY_CARE_PROVIDER_SITE_OTHER): Payer: BLUE CROSS/BLUE SHIELD | Admitting: Family Medicine

## 2019-08-29 ENCOUNTER — Other Ambulatory Visit: Payer: Self-pay

## 2019-08-29 ENCOUNTER — Encounter: Payer: Self-pay | Admitting: Family Medicine

## 2019-08-29 VITALS — BP 110/70 | HR 95 | Temp 96.9°F | Resp 15 | Wt 226.4 lb

## 2019-08-29 DIAGNOSIS — Z136 Encounter for screening for cardiovascular disorders: Secondary | ICD-10-CM

## 2019-08-29 DIAGNOSIS — M1710 Unilateral primary osteoarthritis, unspecified knee: Secondary | ICD-10-CM | POA: Diagnosis not present

## 2019-08-29 DIAGNOSIS — F32A Depression, unspecified: Secondary | ICD-10-CM

## 2019-08-29 DIAGNOSIS — F329 Major depressive disorder, single episode, unspecified: Secondary | ICD-10-CM

## 2019-08-29 DIAGNOSIS — Z6837 Body mass index (BMI) 37.0-37.9, adult: Secondary | ICD-10-CM

## 2019-08-29 DIAGNOSIS — Z23 Encounter for immunization: Secondary | ICD-10-CM | POA: Diagnosis not present

## 2019-08-29 DIAGNOSIS — F419 Anxiety disorder, unspecified: Secondary | ICD-10-CM

## 2019-08-29 DIAGNOSIS — E039 Hypothyroidism, unspecified: Secondary | ICD-10-CM

## 2019-08-29 DIAGNOSIS — M179 Osteoarthritis of knee, unspecified: Secondary | ICD-10-CM | POA: Insufficient documentation

## 2019-08-29 DIAGNOSIS — Z1159 Encounter for screening for other viral diseases: Secondary | ICD-10-CM

## 2019-08-29 NOTE — Progress Notes (Signed)
Patient: Barbara Underwood Female    DOB: 01-Sep-1956   63 y.o.   MRN: [id number] Visit Date: 08/29/2019  Today's Provider: Lelon Huh, MD   Chief Complaint  Patient presents with  . Hypothyroidism   Subjective:     HPI  Hypothyroid, follow-up:  No results found for: TSH Wt Readings from Last 3 Encounters:  08/29/19 226 lb 6.4 oz (102.7 kg)  09/20/18 209 lb 9.6 oz (95.1 kg)  01/14/18 222 lb (100.7 kg)   She reports excellent compliance with treatment. She is not having side effects.  She is not exercising. She is experiencing change in energy level, weight changes and chest pressure She denies diarrhea, heat / cold intolerance, nervousness and palpitations Weight trend: increasing steadily She has not been able to walk due to OA in knee and has required several injections at [facility] orthopedics.  ------------------------------------------------------------------------  Allergies  Allergen Reactions  . Prednisone Other (See Comments)    Makes patient feel violently ill   . Codeine Nausea And Vomiting    Also dizziness     Current Outpatient Medications:  .  buPROPion (WELLBUTRIN XL) 300 MG 24 hr tablet, Take by mouth., Disp: , Rfl:  .  DULoxetine (CYMBALTA) 60 MG capsule, Take 60 mg by mouth every morning., Disp: , Rfl:  .  levothyroxine (SYNTHROID) 75 MCG tablet, TAKE 1 TABLET BY MOUTH EVERY DAY, Disp: 90 tablet, Rfl: 0 .  Multiple Vitamin (MULTIVITAMIN) capsule, Take 1 capsule by mouth daily., Disp: , Rfl:  .  TRAZODONE HCL PO, Take 75 mg by mouth at bedtime., Disp: , Rfl:   Review of Systems  Social History   Tobacco Use  . Smoking status: Never Smoker  . Smokeless tobacco: Never Used  Substance Use Topics  . Alcohol use: Yes    Alcohol/week: 0.0 standard drinks    Comment: rare      Objective:   BP 110/70   Pulse 95   Temp (!) 96.9 F (36.1 C) (Oral)   Resp 15   Wt 226 lb 6.4 oz (102.7 kg)   SpO2 98%   BMI 37.67 kg/m     Physical Exam   . General Appearance:    Alert, cooperative, no distress, obese  Eyes:    PERRL, conjunctiva/corneas clear, EOM's intact       Lungs:     Clear to auscultation bilaterally, respirations unlabored  Heart:    Normal heart rate. Normal rhythm. No murmurs, rubs, or gallops.   MS:   All extremities are intact.   Neurologic:   Awake, alert, oriented x 3. No apparent focal neurological           defect.            Assessment & Plan    1. Adult hypothyroidism  - TSH - T4, free - Lipid panel  2. Class 2 severe obesity with serious comorbidity and body mass index (BMI) of 37.0 to 37.9 in adult, unspecified obesity type ([place]) Complicated by OA if knee, limiting ability to exercise.  - Comprehensive metabolic panel - Lipid panel  3. Primary osteoarthritis of knee, unspecified laterality Continue follow up at [facility] orthopedics.   4. Encounter for special screening examination for cardiovascular disorder  - Lipid panel  5. Need for hepatitis C screening test  - Hepatitis C antibody  6. Need for influenza vaccination  - Flu Vaccine QUAD 36+ mos IM  Continue regular follow up with Dr. Nicolasa Ducking for anxiety and  depression. Advised she is due for follow up with [facility] for routine Gyn/mammogram.     The entirety of the information documented in the History of Present Illness, Review of Systems and Physical Exam were personally obtained by me. Portions of this information were initially documented by Minette Headland, CMA and reviewed by me for thoroughness and accuracy.      Lelon Huh, MD  [facility]

## 2019-08-29 NOTE — Patient Instructions (Signed)
.   Please review the attached list of medications and notify my office if there are any errors.   . Please bring all of your medications to every appointment so we can make sure that our medication list is the same as yours.   . It is especially important to get the annual flu vaccine this year. If you haven't had it already, please go to your pharmacy or call the office as soon as possible to schedule you flu shot.  

## 2019-08-30 LAB — LIPID PANEL
Chol/HDL Ratio: 4.1 ratio (ref 0.0–4.4)
Cholesterol, Total: 252 mg/dL — ABNORMAL HIGH (ref 100–199)
HDL: 61 mg/dL (ref >39)
LDL Chol Calc (NIH): 172 mg/dL — ABNORMAL HIGH (ref 0–99)
Triglycerides: 107 mg/dL (ref 0–149)
VLDL Cholesterol Cal: 19 mg/dL (ref 5–40)

## 2019-08-30 LAB — COMPREHENSIVE METABOLIC PANEL
ALT: 18 IU/L (ref 0–32)
AST: 19 IU/L (ref 0–40)
Albumin/Globulin Ratio: 1.8 (ref 1.2–2.2)
Albumin: 4.4 g/dL (ref 3.8–4.8)
Alkaline Phosphatase: 98 IU/L (ref 39–117)
BUN/Creatinine Ratio: 11 — ABNORMAL LOW (ref 12–28)
BUN: 11 mg/dL (ref 8–27)
Bilirubin Total: <0.2 mg/dL (ref 0.0–1.2)
CO2: 22 mmol/L (ref 20–29)
Calcium: 9.4 mg/dL (ref 8.7–10.3)
Chloride: 105 mmol/L (ref 96–106)
Creatinine, Ser: 1 mg/dL (ref 0.57–1.00)
GFR calc Af Amer: 69 mL/min/{1.73_m2} (ref >59)
GFR calc non Af Amer: 60 mL/min/{1.73_m2} (ref >59)
Globulin, Total: 2.4 g/dL (ref 1.5–4.5)
Glucose: 108 mg/dL — ABNORMAL HIGH (ref 65–99)
Potassium: 4.4 mmol/L (ref 3.5–5.2)
Sodium: 140 mmol/L (ref 134–144)
Total Protein: 6.8 g/dL (ref 6.0–8.5)

## 2019-08-30 LAB — T4, FREE: Free T4: 1 ng/dL (ref 0.82–1.77)

## 2019-08-30 LAB — TSH: TSH: 3.27 u[IU]/mL (ref 0.450–4.500)

## 2019-08-30 LAB — HEPATITIS C ANTIBODY: Hep C Virus Ab: <0.1 s/co ratio (ref 0.0–0.9)

## 2019-09-01 ENCOUNTER — Telehealth: Payer: Self-pay

## 2019-09-01 NOTE — Telephone Encounter (Signed)
Patient advised as below.  

## 2019-12-02 ENCOUNTER — Other Ambulatory Visit: Payer: Self-pay | Admitting: Family Medicine

## 2020-01-08 DIAGNOSIS — E7800 Pure hypercholesterolemia, unspecified: Secondary | ICD-10-CM | POA: Insufficient documentation

## 2020-01-08 DIAGNOSIS — F325 Major depressive disorder, single episode, in full remission: Secondary | ICD-10-CM | POA: Insufficient documentation

## 2020-01-08 DIAGNOSIS — R7303 Prediabetes: Secondary | ICD-10-CM | POA: Insufficient documentation

## 2020-03-30 ENCOUNTER — Other Ambulatory Visit: Payer: Self-pay | Admitting: Family Medicine

## 2020-03-30 NOTE — Telephone Encounter (Signed)
Requested Prescriptions  Pending Prescriptions Disp Refills  . levothyroxine (SYNTHROID) 75 MCG tablet [Pharmacy Med Name: LEVOTHYROXINE 75 MCG TABLET] 90 tablet 1    Sig: TAKE 1 TABLET BY MOUTH EVERY DAY     Endocrinology:  Hypothyroid Agents Failed - 03/30/2020  2:09 AM      Failed - TSH needs to be rechecked within 3 months after an abnormal result. Refill until TSH is due.      Passed - TSH in normal range and within 360 days    TSH  Date Value Ref Range Status  08/29/2019 3.270 0.450 - 4.500 uIU/mL Final         Passed - Valid encounter within last 12 months    Recent Outpatient Visits          7 months ago Adult hypothyroidism   [facility] Birdie Sons, MD   1 year ago Acute frontal sinusitis, recurrence not specified   [place], [place]   1 year ago Post-concussion headache   [place], [place]   2 years ago Pharyngitis, unspecified etiology   [place], [place]   2 years ago Cough   [place], [place], [place]

## 2021-01-04 DIAGNOSIS — G4733 Obstructive sleep apnea (adult) (pediatric): Secondary | ICD-10-CM | POA: Insufficient documentation

## 2021-02-09 ENCOUNTER — Ambulatory Visit
Admission: EM | Admit: 2021-02-09 | Discharge: 2021-02-09 | Disposition: A | Discharge status: Home / Self Care | Payer: BLUE CROSS/BLUE SHIELD | Attending: Emergency Medicine | Admitting: Emergency Medicine

## 2021-02-09 ENCOUNTER — Other Ambulatory Visit: Payer: Self-pay

## 2021-02-09 DIAGNOSIS — H6122 Impacted cerumen, left ear: Secondary | ICD-10-CM | POA: Diagnosis not present

## 2021-02-09 NOTE — ED Triage Notes (Signed)
Pt c/o acute diminished hearing left ear; onset yesterday after showering.  Pt denies pain, URI symptoms. Tried H2O2 and alcohol drops wi/o improvement.

## 2021-02-09 NOTE — ED Provider Notes (Signed)
[facility]    CSN: [id number] Arrival date & time: 02/09/21  0907      History   Chief Complaint Chief Complaint  Patient presents with  . left ear diminished hearing    HPI Barbara Underwood is a 65 y.o. female.   Arzu Mcgaughey presents with complaints of loss of hearing to left ear which she noted yesterday. No pain. No drainage. Started while she was in the shower yesterday. She has tried putting alcohol, hydrogen peroxide in the ear which haven't helped, and has tried taking an oral antihistamine which hasn't helped. Denies any previous similar. No other URI symptoms.     ROS per HPI, negative if not otherwise mentioned.      Past Medical History:  Diagnosis Date  . Angiomyolipoma of right kidney    stable  . Mitral valve prolapse    Told she had as child, now told VERY mild, no need for prophylaxis  . Motion sickness    car rides    Patient Active Problem List   Diagnosis Date Noted  . OA (osteoarthritis) of knee 08/29/2019  . Headache, migraine 06/30/2015  . Adult hypothyroidism 06/30/2015  . Anxiety and depression 06/30/2015  . Benign neoplasm 06/30/2015  . Billowing mitral valve 06/30/2015  . Adiposity 06/30/2015  . Kidney lump 06/30/2015    Past Surgical History:  Procedure Laterality Date  . APPENDECTOMY  2006   Dr Fleet Contras, [facility]  . COLONOSCOPY N/A 05/06/2015   Procedure: COLONOSCOPY;  Surgeon: Lucilla Lame, MD;  Location: [place];  Service: Gastroenterology;  Laterality: N/A;  . ESOPHAGOGASTRODUODENOSCOPY N/A 05/06/2015   Procedure: ESOPHAGOGASTRODUODENOSCOPY (EGD);  Surgeon: Lucilla Lame, MD;  Location: [place];  Service: Gastroenterology;  Laterality: N/A;  . TONSILLECTOMY    . TUBAL LIGATION  1988    OB History   No obstetric history on file.      Home Medications    Prior to Admission medications   Medication Sig Start Date End Date Taking? Authorizing Provider  buPROPion (WELLBUTRIN XL) 300 MG 24 hr  tablet Take by mouth. 06/18/19  Yes [provider]  DULoxetine (CYMBALTA) 60 MG capsule Take 60 mg by mouth every morning.   Yes [provider]  levothyroxine (SYNTHROID) 75 MCG tablet TAKE 1 TABLET BY MOUTH EVERY DAY 03/30/20  Yes Birdie Sons, MD  TRAZODONE HCL PO Take 75 mg by mouth at bedtime.   Yes [provider]  Multiple Vitamin (MULTIVITAMIN) capsule Take 1 capsule by mouth daily.    [provider]    Family History Family History  Problem Relation Age of Onset  . Kidney cancer Neg Hx   . Bladder Cancer Neg Hx     Social History Social History   Tobacco Use  . Smoking status: Never Smoker  . Smokeless tobacco: Never Used  Substance Use Topics  . Alcohol use: Yes    Alcohol/week: 0.0 standard drinks    Comment: rare  . Drug use: No     Allergies   Prednisone and Codeine   Review of Systems Review of Systems   Physical Exam Triage Vital Signs ED Triage Vitals  Enc Vitals Group     BP 02/09/21 0929 119/79     Pulse Rate 02/09/21 0929 83     Resp 02/09/21 0929 18     Temp 02/09/21 0929 98.5 F (36.9 C)     Temp Source 02/09/21 0929 Oral     SpO2 02/09/21 0929 97 %  Weight --      Height --      Head Circumference --      Peak Flow --      Pain Score 02/09/21 0927 0     Pain Loc --      Pain Edu? --      Excl. in [place]? --    No data found.  Updated Vital Signs BP 119/79 (BP Location: Left Arm)   Pulse 83   Temp 98.5 F (36.9 C) (Oral)   Resp 18   SpO2 97%   Visual Acuity Right Eye Distance:   Left Eye Distance:   Bilateral Distance:    Right Eye Near:   Left Eye Near:    Bilateral Near:     Physical Exam Constitutional:      General: She is not in acute distress.    Appearance: She is well-developed.  HENT:     Right Ear: Tympanic membrane and ear canal normal.     Left Ear: There is impacted cerumen.  Cardiovascular:     Rate and Rhythm: Normal rate.  Pulmonary:     Effort: Pulmonary  effort is normal.  Skin:    General: Skin is warm and dry.  Neurological:     Mental Status: She is alert and oriented to person, place, and time.      UC Treatments / Results  Labs (all labs ordered are listed, but only abnormal results are displayed) Labs Reviewed - No data to display  EKG   Radiology No results found.  Procedures Procedures (including critical care time)  Medications Ordered in UC Medications - No data to display  Initial Impression / Assessment and Plan / UC Course  I have reviewed the triage vital signs and the nursing notes.  Pertinent labs & imaging results that were available during my care of the patient were reviewed by me and considered in my medical decision making (see chart for details).     Cerumen removal via irrigation by nursing staff, successful. TM and canal otherwise normal. Hearing back at baseline and patient reports resolution of symptoms.  Final Clinical Impressions(s) / UC Diagnoses   Final diagnoses:  Impacted cerumen of left ear   Discharge Instructions   None    ED Prescriptions    None     PDMP not reviewed this encounter.   Zigmund Gottron, NP 02/09/21 1019

## 2021-02-09 NOTE — ED Notes (Signed)
Irrigated left ear and evacuated large amount approx 1cm diameter gray/yellow cerumen. Pt tolerated well.

## 2021-05-03 ENCOUNTER — Other Ambulatory Visit: Payer: Self-pay

## 2021-05-03 ENCOUNTER — Encounter: Payer: Self-pay | Admitting: Physician Assistant

## 2021-05-03 ENCOUNTER — Ambulatory Visit (INDEPENDENT_AMBULATORY_CARE_PROVIDER_SITE_OTHER): Payer: Medicare Other | Admitting: Physician Assistant

## 2021-05-03 VITALS — BP 123/78 | HR 75 | Ht 65.0 in | Wt 221.0 lb

## 2021-05-03 DIAGNOSIS — N2889 Other specified disorders of kidney and ureter: Secondary | ICD-10-CM | POA: Diagnosis not present

## 2021-05-03 DIAGNOSIS — R1032 Left lower quadrant pain: Secondary | ICD-10-CM

## 2021-05-03 NOTE — Progress Notes (Signed)
05/03/2021 9:38 AM   Barbara Underwood 1956-05-19 [id number]  CC: Chief Complaint  Patient presents with  . Dysuria    HPI: Barbara Underwood is a 65 y.o. 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   Today she reports a 7 to 10-day history of LLQ pain radiating to the lower left flank.  She describes the pain as a constant, mild, dull ache that has awoken her from sleep.  The pain is no worse with palpation.  She denies constipation, diarrhea, gross hematuria, fever, chills, nausea, and vomiting.  She has not taken any medication for management of her pain.  She denies a history of back problems.  She still has her uterus and has a history of ovarian cysts in her 97s.  She is overdue for OB/GYN follow-up.  She denies unintentional weight loss.  In-office UA today pan negative; urine microscopy with >10 epithelial cells/hpf and few+ bacteria.  PMH: Past Medical History:  Diagnosis Date  . Angiomyolipoma of right kidney    stable  . Mitral valve prolapse    Told she had as child, now told VERY mild, no need for prophylaxis  . Motion sickness    car rides    Surgical History: Past Surgical History:  Procedure Laterality Date  . APPENDECTOMY  2006   Dr Fleet Contras, [facility]  . COLONOSCOPY N/A 05/06/2015   Procedure: COLONOSCOPY;  Surgeon: Lucilla Lame, MD;  Location: [place];  Service: Gastroenterology;  Laterality: N/A;  . ESOPHAGOGASTRODUODENOSCOPY N/A 05/06/2015   Procedure: ESOPHAGOGASTRODUODENOSCOPY (EGD);  Surgeon: Lucilla Lame, MD;  Location: [place];  Service: Gastroenterology;  Laterality: N/A;  . TONSILLECTOMY    . TUBAL LIGATION  1988    Home Medications:  Allergies as of 05/03/2021      Reactions   Prednisone Other (See Comments)   Makes patient feel violently ill    Codeine Nausea And Vomiting   Also dizziness      Medication List       Accurate as  of May 03, 2021  9:38 AM. If you have any questions, ask your nurse or doctor.        buPROPion 300 MG 24 hr tablet Commonly known as: WELLBUTRIN XL Take by mouth.   DULoxetine 60 MG capsule Commonly known as: CYMBALTA Take 60 mg by mouth every morning.   levothyroxine 75 MCG tablet Commonly known as: SYNTHROID TAKE 1 TABLET BY MOUTH EVERY DAY   levothyroxine 88 MCG tablet Commonly known as: SYNTHROID Take 88 mcg by mouth every morning.   multivitamin capsule Take 1 capsule by mouth daily.   TRAZODONE HCL PO Take 75 mg by mouth at bedtime.       Allergies:  Allergies  Allergen Reactions  . Prednisone Other (See Comments)    Makes patient feel violently ill   . Codeine Nausea And Vomiting    Also dizziness    Family History: Family History  Problem Relation Age of Onset  . Kidney cancer Neg Hx   . Bladder Cancer Neg Hx     Social History:   reports that she has never smoked. She has never used smokeless tobacco. She reports current alcohol use. She reports that she does not use drugs.  Physical Exam: BP 123/78   Pulse 75   Ht 5\' 5"  (1.651 m)   Wt 221 lb (100.2 kg)   BMI 36.78 kg/m   Constitutional:  Alert and oriented, no acute distress, nontoxic appearing HEENT: [place], AT Cardiovascular: No clubbing, cyanosis, or edema Respiratory: Normal respiratory effort, no increased work of breathing GI: Abdomen is soft, nontender, nondistended, no guarding or rebound GU: No CVA tenderness Skin: No rashes, bruises or suspicious lesions Neurologic: Grossly intact, no focal deficits, moving all 4 extremities Psychiatric: Normal mood and affect  Laboratory Data: Results for orders placed or performed in visit on 05/03/21  Microscopic Examination   Urine  Result Value Ref Range   WBC, UA None seen 0 - 5 /hpf   RBC None seen 0 - 2 /hpf   Epithelial Cells (non renal) >10 (A) 0 - 10 /hpf   Bacteria, UA Few None seen/Few  Urinalysis, Complete  Result Value Ref  Range   Specific Gravity, UA 1.020 1.005 - 1.030   pH, UA 5.0 5.0 - 7.5   Color, UA Yellow Yellow   Appearance Ur Cloudy (A) Clear   Leukocytes,UA Negative Negative   Protein,UA Negative Negative/Trace   Glucose, UA Negative Negative   Ketones, UA Negative Negative   RBC, UA Negative Negative   Bilirubin, UA Negative Negative   Urobilinogen, Ur 0.2 0.2 - 1.0 mg/dL   Nitrite, UA Negative Negative   Microscopic Examination See below:    Assessment & Plan:   1. LLQ pain UA benign despite evidence of contamination today.  Low concern for urinary infection.  VSS and in no acute distress today, no indication for urgent intervention.  I suspect her subacute presentation may be MSK in origin, however if it persists may consider abdominal imaging for further evaluation. - Urinalysis, Complete  2. Right renal mass Overdue for surveillance renal ultrasound, will order this and plan for results review and symptom recheck with Dr. Erlene Quan.  Patient is in agreement with this plan. - US RENAL; Future   Return in about 4 weeks (around 05/31/2021) for RUS results and symptom recheck with Dr. Erlene Quan.  Debroah Loop, PA-C  [facility] [address] [place], [place] [address] [phone]

## 2021-05-05 LAB — MICROSCOPIC EXAMINATION
Epithelial Cells (non renal): >10 /hpf — AB (ref 0–10)
RBC, Urine: NONE SEEN /hpf (ref 0–2)
WBC, UA: NONE SEEN /hpf (ref 0–5)

## 2021-05-05 LAB — URINALYSIS, COMPLETE
Bilirubin, UA: NEGATIVE
Glucose, UA: NEGATIVE
Ketones, UA: NEGATIVE
Leukocytes,UA: NEGATIVE
Nitrite, UA: NEGATIVE
Protein,UA: NEGATIVE
RBC, UA: NEGATIVE
Specific Gravity, UA: 1.02 (ref 1.005–1.030)
Urobilinogen, Ur: 0.2 mg/dL (ref 0.2–1.0)
pH, UA: 5 (ref 5.0–7.5)

## 2021-06-14 ENCOUNTER — Ambulatory Visit
Admission: RE | Admit: 2021-06-14 | Discharge: 2021-06-14 | Disposition: A | Discharge status: Home / Self Care | Payer: Medicare Other | Source: Ambulatory Visit | Attending: Physician Assistant | Admitting: Physician Assistant

## 2021-06-14 ENCOUNTER — Other Ambulatory Visit: Payer: Self-pay

## 2021-06-14 DIAGNOSIS — N2889 Other specified disorders of kidney and ureter: Secondary | ICD-10-CM | POA: Diagnosis not present

## 2021-06-15 ENCOUNTER — Ambulatory Visit (INDEPENDENT_AMBULATORY_CARE_PROVIDER_SITE_OTHER): Payer: Medicare Other | Admitting: Urology

## 2021-06-15 VITALS — BP 114/77 | HR 70 | Ht 65.0 in | Wt 221.0 lb

## 2021-06-15 DIAGNOSIS — N2889 Other specified disorders of kidney and ureter: Secondary | ICD-10-CM | POA: Diagnosis not present

## 2021-06-15 NOTE — Progress Notes (Signed)
06/15/2021 9:17 AM   Barbara Underwood 12/09/1956 [id number]  Referring provider: [place] [address] [place],  [place] [address]  Chief Complaint  Patient presents with   Right renal mass    HPI: 65 year old female with a known exophytic right upper pole renal mass undergoing surveillance who returns today for follow-up.  She is significantly overdue for this, last seen in 2018.  She has history of an 11 mm lesion for seen in June 2007.  At the time, there was a small fat-containing component was felt to represent a probable AML.  Prior to today, her last renal ultrasound was on/2018 at which time the lesion measured 1.2 x 1.5 x 1.3 cm in the right upper pole.  This actually smaller than on previous comparison ultrasound.  Renal ultrasound today shows lesion is essentially stable at 1.2 x 1.2 x 1.2 cm on the right kidney.  She denies any urinary symptoms.  She is having some persistent left inguinal pain worse with physical activity but is improving.  PMH: Past Medical History:  Diagnosis Date   Angiomyolipoma of right kidney    stable   Mitral valve prolapse    Told she had as child, now told VERY mild, no need for prophylaxis   Motion sickness    car rides    Surgical History: Past Surgical History:  Procedure Laterality Date   APPENDECTOMY  2006   Dr Fleet Contras, [facility]   COLONOSCOPY N/A 05/06/2015   Procedure: COLONOSCOPY;  Surgeon: Lucilla Lame, MD;  Location: [place];  Service: Gastroenterology;  Laterality: N/A;   ESOPHAGOGASTRODUODENOSCOPY N/A 05/06/2015   Procedure: ESOPHAGOGASTRODUODENOSCOPY (EGD);  Surgeon: Lucilla Lame, MD;  Location: [place];  Service: Gastroenterology;  Laterality: N/A;   TONSILLECTOMY     TUBAL LIGATION  1988    Home Medications:  Allergies as of 06/15/2021       Reactions   Prednisone Other (See Comments)   Makes patient feel violently ill    Codeine Nausea And Vomiting   Also dizziness         Medication List        Accurate as of June 15, 2021  9:17 AM. If you have any questions, ask your nurse or doctor.          STOP taking these medications    DULoxetine 60 MG capsule Commonly known as: CYMBALTA       TAKE these medications    buPROPion 300 MG 24 hr tablet Commonly known as: WELLBUTRIN XL Take by mouth.   levothyroxine 88 MCG tablet Commonly known as: SYNTHROID Take 88 mcg by mouth every morning. What changed: Another medication with the same name was removed. Continue taking this medication, and follow the directions you see here.   multivitamin capsule Take 1 capsule by mouth daily.   traZODone 50 MG tablet Commonly known as: DESYREL Take by mouth. What changed: Another medication with the same name was removed. Continue taking this medication, and follow the directions you see here.        Allergies:  Allergies  Allergen Reactions   Prednisone Other (See Comments)    Makes patient feel violently ill    Codeine Nausea And Vomiting    Also dizziness    Family History: Family History  Problem Relation Age of Onset   Kidney cancer Neg Hx    Bladder Cancer Neg Hx     Social History:  reports that she has never smoked. She has never  used smokeless tobacco. She reports current alcohol use. She reports that she does not use drugs.   Physical Exam: BP 114/77   Pulse 70   Ht 5\' 5"  (1.651 m)   Wt 221 lb (100.2 kg)   BMI 36.78 kg/m   Constitutional:  Alert and oriented, No acute distress. HEENT: [place] AT, moist mucus membranes.  Trachea midline, no masses. Cardiovascular: No clubbing, cyanosis, or edema. Respiratory: Normal respiratory effort, no increased work of breathing.. Skin: No rashes, bruises or suspicious lesions. Neurologic: Grossly intact, no focal deficits, moving all 4 extremities. Psychiatric: Normal mood and affect.   Pertinent Imaging: I personally reviewed the images of the renal ultrasound today with the final  radiological read pending, interpretation as outlined below above.  Right exophytic solid renal mass stable 1.2 x 1.2 x 1 to 2 cm today.  Assessment & Plan:    1. Right renal mass Stable 1.2 cm solid exophytic right renal mass, most likely AML based on previous characterization although not definitive  Overall stability over numerous years is suggestive of a benign process  Would recommend follow-up renal ultrasound in 2 to 3 years for intermittent surveillance    Hollice Espy, MD  [facility] [address] [place], [place] [address] [phone]

## 2021-08-11 DIAGNOSIS — M25512 Pain in left shoulder: Secondary | ICD-10-CM | POA: Insufficient documentation

## 2021-08-12 DIAGNOSIS — M249 Joint derangement, unspecified: Secondary | ICD-10-CM | POA: Insufficient documentation

## 2022-04-12 ENCOUNTER — Other Ambulatory Visit: Payer: Self-pay | Admitting: Internal Medicine

## 2022-04-12 DIAGNOSIS — Z1231 Encounter for screening mammogram for malignant neoplasm of breast: Secondary | ICD-10-CM

## 2022-05-12 ENCOUNTER — Inpatient Hospital Stay
Admission: RE | Admit: 2022-05-12 | Discharge: 2022-05-12 | Disposition: A | Discharge status: Home / Self Care | Payer: Self-pay | Source: Ambulatory Visit | Attending: *Deleted | Admitting: *Deleted

## 2022-05-12 ENCOUNTER — Ambulatory Visit
Admission: RE | Admit: 2022-05-12 | Discharge: 2022-05-12 | Disposition: A | Discharge status: Home / Self Care | Payer: Medicare Other | Source: Ambulatory Visit | Attending: Internal Medicine | Admitting: Internal Medicine

## 2022-05-12 ENCOUNTER — Other Ambulatory Visit: Payer: Self-pay | Admitting: *Deleted

## 2022-05-12 DIAGNOSIS — Z1231 Encounter for screening mammogram for malignant neoplasm of breast: Secondary | ICD-10-CM | POA: Insufficient documentation

## 2022-09-25 NOTE — Discharge Instructions (Signed)
Instructions after Total Knee Replacement   Barbara Underwood, Jr., M.D.     Dept. of Orthopaedics & Sports Medicine  [facility]  [address]  [place], [place]  [address]  Phone: [phone]   Fax: [phone]    DIET: Drink plenty of non-alcoholic fluids. Resume your normal diet. Include foods high in fiber.  ACTIVITY:  You may use crutches or a walker with weight-bearing as tolerated, unless instructed otherwise. You may be weaned off of the walker or crutches by your Physical Therapist.  Do NOT place pillows under the knee. Anything placed under the knee could limit your ability to straighten the knee.   Continue doing gentle exercises. Exercising will reduce the pain and swelling, increase motion, and prevent muscle weakness.   Please continue to use the TED compression stockings for 6 weeks. You may remove the stockings at night, but should reapply them in the morning. Do not drive or operate any equipment until instructed.  WOUND CARE:  Continue to use the PolarCare or ice packs periodically to reduce pain and swelling. You may bathe or shower after the staples are removed at the first office visit following surgery.  MEDICATIONS: You may resume your regular medications. Please take the pain medication as prescribed on the medication. Do not take pain medication on an empty stomach. You have been given a prescription for a blood thinner (Lovenox or Coumadin). Please take the medication as instructed. (NOTE: After completing a 2 week course of Lovenox, take one Enteric-coated aspirin once a day. This along with elevation will help reduce the possibility of phlebitis in your operated leg.) Do not drive or drink alcoholic beverages when taking pain medications.  CALL THE OFFICE FOR: Temperature above 101 degrees Excessive bleeding or drainage on the dressing. Excessive swelling, coldness, or paleness of the toes. Persistent nausea and vomiting.  FOLLOW-UP:  You  should have an appointment to return to the office in 10-14 days after surgery. Arrangements have been made for continuation of Physical Therapy (either home therapy or outpatient therapy).   [facility] Department Directory         [email or web address]       [email or web address]          Cardiology  Appointments: [place] - [phone] [place] - [phone]  Endocrinology  Appointments: [place] - [phone] [place] - [phone]  Gastroenterology  Appointments: [place] - [phone] [place] - [phone]        General Surgery   Appointments: [place] - [phone]  Internal Medicine/Family Medicine  Appointments: [place] - [phone] [place] - [phone] [place] - [phone]  Metabolic and Weigh Loss Surgery  Appointments: [place] - [phone]        Neurology  Appointments: [place] - [phone] [place] - [phone]  Neurosurgery  Appointments: [place] - [phone]  Obstetrics & Gynecology  Appointments: [place] - [phone] [place] - [phone]        Pediatrics  Appointments: [place] - [phone] [place] - [phone]  Physiatry  Appointments: [place] -[phone]  Physical Therapy  Appointments: [place] - [phone] [place] - [phone]        Podiatry  Appointments: [place] - [phone] [place] - [phone]  Pulmonology  Appointments: [place] - [phone]  Rheumatology  Appointments: [place] - [phone]        [place] Location: [facility]  [address] [place], [place]  [address]  [place] Location: [facility] [address] [place], [place]  [address]  [place] Location: [facility] [address] [place], [place]  [address]    

## 2022-10-02 ENCOUNTER — Other Ambulatory Visit: Payer: Self-pay

## 2022-10-02 ENCOUNTER — Encounter
Admission: RE | Admit: 2022-10-02 | Discharge: 2022-10-02 | Disposition: A | Discharge status: Home / Self Care | Payer: Medicare Other | Source: Ambulatory Visit | Attending: Orthopedic Surgery | Admitting: Orthopedic Surgery

## 2022-10-02 VITALS — BP 155/79 | HR 71 | Temp 97.9°F | Resp 20 | Ht 65.0 in | Wt 210.3 lb

## 2022-10-02 DIAGNOSIS — R7303 Prediabetes: Secondary | ICD-10-CM | POA: Insufficient documentation

## 2022-10-02 DIAGNOSIS — N2889 Other specified disorders of kidney and ureter: Secondary | ICD-10-CM | POA: Diagnosis not present

## 2022-10-02 DIAGNOSIS — E039 Hypothyroidism, unspecified: Secondary | ICD-10-CM | POA: Diagnosis not present

## 2022-10-02 DIAGNOSIS — Z01818 Encounter for other preprocedural examination: Secondary | ICD-10-CM | POA: Diagnosis present

## 2022-10-02 DIAGNOSIS — M1712 Unilateral primary osteoarthritis, left knee: Secondary | ICD-10-CM | POA: Insufficient documentation

## 2022-10-02 DIAGNOSIS — M1710 Unilateral primary osteoarthritis, unspecified knee: Secondary | ICD-10-CM

## 2022-10-02 HISTORY — DX: Sleep apnea, unspecified: G47.30

## 2022-10-02 HISTORY — DX: Depression, unspecified: F32.A

## 2022-10-02 HISTORY — DX: Hypothyroidism, unspecified: E03.9

## 2022-10-02 HISTORY — DX: Anxiety disorder, unspecified: F41.9

## 2022-10-02 LAB — CBC
HCT: 38.9 % (ref 36.0–46.0)
Hemoglobin: 12.4 g/dL (ref 12.0–15.0)
MCH: 27.9 pg (ref 26.0–34.0)
MCHC: 31.9 g/dL (ref 30.0–36.0)
MCV: 87.4 fL (ref 80.0–100.0)
Platelets: 162 10*3/uL (ref 150–400)
RBC: 4.45 MIL/uL (ref 3.87–5.11)
RDW: 13.4 % (ref 11.5–15.5)
WBC: 5.8 10*3/uL (ref 4.0–10.5)
nRBC: 0 % (ref 0.0–0.2)

## 2022-10-02 LAB — URINALYSIS, ROUTINE W REFLEX MICROSCOPIC
Bilirubin Urine: NEGATIVE
Glucose, UA: NEGATIVE mg/dL
Hgb urine dipstick: NEGATIVE
Ketones, ur: NEGATIVE mg/dL
Leukocytes,Ua: NEGATIVE
Nitrite: NEGATIVE
Protein, ur: NEGATIVE mg/dL
Specific Gravity, Urine: 1.002 — ABNORMAL LOW (ref 1.005–1.030)
pH: 6 (ref 5.0–8.0)

## 2022-10-02 LAB — COMPREHENSIVE METABOLIC PANEL
ALT: 24 U/L (ref 0–44)
AST: 22 U/L (ref 15–41)
Albumin: 4.2 g/dL (ref 3.5–5.0)
Alkaline Phosphatase: 81 U/L (ref 38–126)
Anion gap: 7 (ref 5–15)
BUN: 10 mg/dL (ref 8–23)
CO2: 28 mmol/L (ref 22–32)
Calcium: 9.6 mg/dL (ref 8.9–10.3)
Chloride: 104 mmol/L (ref 98–111)
Creatinine, Ser: 0.83 mg/dL (ref 0.44–1.00)
GFR, Estimated: >60 mL/min (ref >60)
Glucose, Bld: 99 mg/dL (ref 70–99)
Potassium: 3.9 mmol/L (ref 3.5–5.1)
Sodium: 139 mmol/L (ref 135–145)
Total Bilirubin: 0.7 mg/dL (ref 0.3–1.2)
Total Protein: 7.8 g/dL (ref 6.5–8.1)

## 2022-10-02 LAB — TYPE AND SCREEN
ABO/RH(D): A POS
Antibody Screen: NEGATIVE

## 2022-10-02 LAB — SURGICAL PCR SCREEN
MRSA, PCR: NEGATIVE
Staphylococcus aureus: NEGATIVE

## 2022-10-02 LAB — HEMOGLOBIN A1C
Hgb A1c MFr Bld: 5.4 % (ref 4.8–5.6)
Mean Plasma Glucose: 108.28 mg/dL

## 2022-10-02 LAB — SEDIMENTATION RATE: Sed Rate: 41 mm/hr — ABNORMAL HIGH (ref 0–30)

## 2022-10-02 LAB — C-REACTIVE PROTEIN: CRP: 0.9 mg/dL (ref <1.0)

## 2022-10-02 NOTE — Patient Instructions (Addendum)
Your procedure is scheduled on: 10/11/2022  Report to the Registration Desk on the 1st floor of the [facility]. To find out your arrival time, please call [phone] between 1PM - 3PM on:  If your arrival time is 6:00 am, do not arrive prior to that time as the [facility] entrance doors do not open until 6:00 am. 10/10/2022   REMEMBER: Instructions that are not followed completely may result in serious medical risk, up to and including death; or upon the discretion of your surgeon and anesthesiologist your surgery may need to be rescheduled.  Do not eat food after midnight the night before surgery.  No gum chewing, lozengers or hard candies.  You may however, drink CLEAR liquids up to 2 hours before you are scheduled to arrive for your surgery. Do not drink anything within 2 hours of your scheduled arrival time.  Clear liquids include: - water  - apple juice without pulp - gatorade (not RED colors) - black coffee or tea (Do NOT add milk or creamers to the coffee or tea) Do NOT drink anything that is not on this list.   TAKE THESE MEDICATIONS THE MORNING OF SURGERY WITH A SIP OF WATER: citalopram (CELEXA) DULoxetine (CYMBALTA) levothyroxine (SYNTHROID)    One week prior to surgery: Stop Anti-inflammatories (NSAIDS) such as Advil, Aleve, Ibuprofen, Motrin, Naproxen, Naprosyn and Aspirin based products such as Excedrin, Goodys Powder, BC Powder. Stop ANY OVER THE COUNTER supplements until after surgery. You may however, continue to take Tylenol if needed for pain up until the day of surgery.  No Alcohol for 24 hours before or after surgery.  No Smoking including e-cigarettes for 24 hours prior to surgery.  No chewable tobacco products for at least 6 hours prior to surgery.  No nicotine patches on the day of surgery.  Do not use any "recreational" drugs for at least a week prior to your surgery.  Please be advised that the combination of cocaine and anesthesia may have  negative outcomes, up to and including death. If you test positive for cocaine, your surgery will be cancelled.  On the morning of surgery brush your teeth with toothpaste and water, you may rinse your mouth with mouthwash if you wish. Do not swallow any toothpaste or mouthwash.  Use CHG Soap as directed on instruction sheet.  Do not wear jewelry, make-up, hairpins, clips or nail polish.  Do not wear lotions, powders, or perfumes.   Do not shave body from the neck down 48 hours prior to surgery just in case you cut yourself which could leave a site for infection.  Also, freshly shaved skin may become irritated if using the CHG soap.  Contact lenses, hearing aids and dentures may not be worn into surgery.  Do not bring valuables to the hospital. [facility] is not responsible for any missing/lost belongings or valuables.   .Bring your C-PAP to the hospital with you in case you may have to spend the night.   Notify your doctor if there is any change in your medical condition (cold, fever, infection).  Wear comfortable clothing (specific to your surgery type) to the hospital.  After surgery, you can help prevent lung complications by doing breathing exercises.  Take deep breaths and cough every 1-2 hours. Your doctor may order a device called an Incentive Spirometer to help you take deep breaths.  If you are being admitted to the hospital overnight, leave your suitcase in the car. After surgery it may be brought to  your room.  If you are being discharged the day of surgery, you will not be allowed to drive home. You will need a responsible adult (18 years or older) to drive you home and stay with you that night.   If you are taking public transportation, you will need to have a responsible adult (18 years or older) with you. Please confirm with your physician that it is acceptable to use public transportation.   Please call the [facility]. at [phone] if you  have any questions about these instructions.  Surgery Visitation Policy:  Patients undergoing a surgery or procedure may have two family members or support persons with them as long as the person is not COVID-19 positive or experiencing its symptoms.   Inpatient Visitation:    Visiting hours are 7 a.m. to 8 p.m. Up to four visitors are allowed at one time in a patient room, including children. The visitors may rotate out with other people during the day. One designated support person (adult) may remain overnight.

## 2022-10-05 NOTE — H&P (Signed)
ORTHOPAEDIC HISTORY & PHYSICAL Barbara Underwood, [place] - 09/29/2022 8:45 AM EDT Formatting of this note is different from the original. [place] [facility] Chief Complaint:   Chief Complaint  Patient presents with  Knee Pain  H & P LEFT KNEE   History of Present Illness:   Barbara Underwood is a 66 y.o. female that presents to clinic today for her preoperative history and evaluation. Patient presents with her husband. The patient is scheduled to undergo a left total knee arthroplasty on 10/11/22 by Dr. Marry Guan. Her pain began several years ago. The pain is located primarily along the medial aspect of the knee. She describes her pain as worse with weightbearing. She reports associated swelling with some giving way of the knee. She denies associated numbness or tingling, denies locking.   The patient's symptoms have progressed to the point that they decrease her quality of life. The patient has previously undergone conservative treatment including NSAIDS and injections to the knee without adequate control of her symptoms.  Patient denies significant cardiac history, history of DVT, or history of lumbar surgery. Reports childhood allergy to penicillin. Her husband will be available to help post-operatively.   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 Rudene Christians  APPENDECTOMY  DNC  TONSILLECTOMY  TUBAL LIGATION  WISDOM TEETH   Current Medications:  Current Outpatient Medications   Medication Sig Dispense Refill  ascorbic acid (VITAMIN C ORAL) Take 250 mg by mouth once daily  calcium carbonate (CALCIUM 500 ORAL) Take 1 tablet by mouth once daily  citalopram (CELEXA) 10 MG tablet Take 10 mg by mouth once daily  DULoxetine (CYMBALTA) 30 MG DR capsule Take 60 mg by mouth once daily  ELDERBERRY FRUIT ORAL Take 1 tablet by mouth once daily  hydrOXYzine (ATARAX) 25 MG tablet Take 25 mg by mouth 2 (two) times daily as needed for Anxiety  levothyroxine (SYNTHROID) 88 MCG tablet TAKE 1 TABLET BY MOUTH EVERY DAY ON AN EMPTY STOMACH WITH WATER 30-60 MINUTES BEFORE BREAKFAST 90 tablet 1  multivitamin tablet Take 1 tablet by mouth once daily  traZODone (DESYREL) 50 MG tablet Take 1 tablet (50 mg total) by mouth at bedtime   No current facility-administered medications for this visit.   Allergies:  Allergies  Allergen Reactions  Prednisone Other (See Comments)  Makes patient feel violently ill  Makes patient feel violently ill   Makes patient feel violently ill  Codeine Nausea And Vomiting and Dizziness    Penicillins Rash   Social History:  Social History   Socioeconomic History  Marital status: Married  Spouse name: Barbara Underwood  Number of children: 3  Years of education: 14  Highest education level: Associate degree: occupational, Hotel manager, or vocational program  Occupational History  Occupation: RetiredAcupuncturist  Tobacco Use  Smoking status: Never  Smokeless tobacco: Never  Vaping Use  Vaping Use: Never used  Substance and Sexual Activity  Alcohol use: Yes  Alcohol/week: 2.0 standard drinks of alcohol  Types: 2 Standard drinks or equivalent per week  Drug use: Never  Sexual activity:  Yes  Partners: Male   Family History:  Family History  Problem Relation Age of Onset  No Known Problems Mother  Alcohol abuse Father  No Known Problems Brother   Review of Systems:   A 10+ ROS was performed, reviewed, and the pertinent orthopaedic findings are  documented in the HPI.   Physical Examination:   BP (!) 140/90 (BP Location: Left upper arm, Patient Position: Sitting, BP Cuff Size: Large Adult)  Ht 165.1 cm ('5\' 5"'$ )  Wt 95.9 kg (211 lb 6.4 oz)  BMI 35.18 kg/m   Patient is a well-developed, well-nourished female in no acute distress. Patient has normal mood and affect. Patient is alert and oriented to person, place, and time.   HEENT: Atraumatic, normocephalic. Pupils equal and reactive to light. Extraocular motion intact. Noninjected sclera.  Cardiovascular: Regular rate and rhythm, with no murmurs, rubs, or gallops. Distal pulses palpable. No carotid bruits.  Respiratory: Lungs clear to auscultation bilaterally.   Left Knee: Soft tissue swelling: minimal Effusion: none Erythema: none Crepitance: mild Tenderness: medial Alignment: relative varus Mediolateral laxity: medial pseudolaxity Posterior sag: negative Patellar tracking: Good tracking without evidence of subluxation or tilt Atrophy: No significant atrophy.  Quadriceps tone was fair to good. Range of motion: 0/0/127 degrees  Patient able to actively plantarflex and dorsiflex the left ankle. Able to flex and extend the toes.  Sensation intact over the saphenous, lateral sural cutaneous, superficial fibular, and deep fibular nerve distributions.  Tests Performed/Reviewed:  X-rays  Review of previous films shows near complete loss of medial compartment joint space with osteophyte formation.  Impression:   ICD-10-CM  1. Primary osteoarthritis of left knee M17.12   Plan:   The patient has end-stage degenerative changes of the left knee. It was explained to the patient that the condition is progressive in nature. Having failed conservative treatment, the patient has elected to proceed with a total joint arthroplasty. The patient will undergo a total joint arthroplasty with Dr. Marry Guan. The risks of surgery, including blood clot and infection, were discussed with the  patient. Measures to reduce these risks, including the use of anticoagulation, perioperative antibiotics, and early ambulation were discussed. The importance of postoperative physical therapy was discussed with the patient. The patient elects to proceed with surgery. The patient is instructed to stop all blood thinners prior to surgery. The patient is instructed to call the hospital the day before surgery to learn of the proper arrival time.   Contact our office with any questions or concerns. Follow up as indicated, or sooner should any new problems arise, if conditions worsen, or if they are otherwise concerned.   Barbara Underwood, [facility] [place], [place] [address] Phone: [phone]  This note was generated in part with voice recognition software and I apologize for any typographical errors that were not detected and corrected.  Electronically signed by Barbara Fudge, PA at 09/29/2022 12:05 PM EDT

## 2022-10-11 ENCOUNTER — Encounter
Admission: RE | Disposition: A | Discharge status: Home / Self Care | Payer: Self-pay | Source: Ambulatory Visit | Attending: Orthopedic Surgery

## 2022-10-11 ENCOUNTER — Other Ambulatory Visit: Payer: Self-pay

## 2022-10-11 ENCOUNTER — Ambulatory Visit: Payer: Medicare Other | Admitting: Urgent Care

## 2022-10-11 ENCOUNTER — Ambulatory Visit: Payer: Medicare Other | Admitting: Anesthesiology

## 2022-10-11 ENCOUNTER — Encounter: Payer: Self-pay | Admitting: Orthopedic Surgery

## 2022-10-11 ENCOUNTER — Observation Stay: Payer: Medicare Other

## 2022-10-11 ENCOUNTER — Observation Stay
Admission: RE | Admit: 2022-10-11 | Discharge: 2022-10-12 | Disposition: A | Discharge status: Home / Self Care | Payer: Medicare Other | Source: Ambulatory Visit | Attending: Orthopedic Surgery | Admitting: Orthopedic Surgery

## 2022-10-11 DIAGNOSIS — N2889 Other specified disorders of kidney and ureter: Secondary | ICD-10-CM

## 2022-10-11 DIAGNOSIS — M1712 Unilateral primary osteoarthritis, left knee: Principal | ICD-10-CM | POA: Insufficient documentation

## 2022-10-11 DIAGNOSIS — Z79899 Other long term (current) drug therapy: Secondary | ICD-10-CM | POA: Diagnosis not present

## 2022-10-11 DIAGNOSIS — Z96659 Presence of unspecified artificial knee joint: Secondary | ICD-10-CM

## 2022-10-11 DIAGNOSIS — F32A Depression, unspecified: Secondary | ICD-10-CM

## 2022-10-11 DIAGNOSIS — E039 Hypothyroidism, unspecified: Secondary | ICD-10-CM | POA: Diagnosis not present

## 2022-10-11 DIAGNOSIS — R7303 Prediabetes: Secondary | ICD-10-CM

## 2022-10-11 HISTORY — PX: TOTAL KNEE ARTHROPLASTY: SHX125

## 2022-10-11 LAB — ABO/RH: ABO/RH(D): A POS

## 2022-10-11 SURGERY — ARTHROPLASTY, KNEE, TOTAL
Anesthesia: Spinal | Site: Knee | Laterality: Left

## 2022-10-11 MED ORDER — SODIUM CHLORIDE 0.9 % IR SOLN
Status: DC | PRN
  Administered 2022-10-11: 3000 mL

## 2022-10-11 MED ORDER — BUPIVACAINE LIPOSOME 1.3 % IJ SUSP
INTRAMUSCULAR | Status: AC
  Filled 2022-10-11: qty 40

## 2022-10-11 MED ORDER — SURGIPHOR WOUND IRRIGATION SYSTEM - OPTIME: TOPICAL | Status: DC | PRN

## 2022-10-11 MED ORDER — ACETAMINOPHEN 325 MG PO TABS: 325.0000 mg | ORAL_TABLET | Freq: Four times a day (QID) | ORAL | Status: DC | PRN

## 2022-10-11 MED ORDER — HYDROMORPHONE HCL 1 MG/ML IJ SOLN
0.5000 mg | INTRAMUSCULAR | Status: DC | PRN
  Administered 2022-10-11: 1 mg via INTRAVENOUS
  Filled 2022-10-11: qty 1

## 2022-10-11 MED ORDER — SODIUM CHLORIDE FLUSH 0.9 % IV SOLN
INTRAVENOUS | Status: AC
  Filled 2022-10-11: qty 80

## 2022-10-11 MED ORDER — CHLORHEXIDINE GLUCONATE 0.12 % MT SOLN: 15.0000 mL | Freq: Once | OROMUCOSAL | Status: AC

## 2022-10-11 MED ORDER — BUPROPION HCL ER (XL) 150 MG PO TB24
300.0000 mg | ORAL_TABLET | Freq: Every day | ORAL | Status: DC
  Administered 2022-10-12: 300 mg via ORAL
  Filled 2022-10-11: qty 2

## 2022-10-11 MED ORDER — ONDANSETRON HCL 4 MG/2ML IJ SOLN
INTRAMUSCULAR | Status: DC | PRN
  Administered 2022-10-11: 4 mg via INTRAVENOUS

## 2022-10-11 MED ORDER — ENOXAPARIN SODIUM 30 MG/0.3ML IJ SOSY
30.0000 mg | PREFILLED_SYRINGE | Freq: Two times a day (BID) | INTRAMUSCULAR | Status: DC
  Administered 2022-10-12: 30 mg via SUBCUTANEOUS
  Filled 2022-10-11: qty 0.3

## 2022-10-11 MED ORDER — CHLORHEXIDINE GLUCONATE 4 % EX LIQD: 60.0000 mL | Freq: Once | CUTANEOUS | Status: DC

## 2022-10-11 MED ORDER — METOCLOPRAMIDE HCL 5 MG PO TABS
10.0000 mg | ORAL_TABLET | Freq: Three times a day (TID) | ORAL | Status: DC
  Administered 2022-10-11 – 2022-10-12 (×3): 10 mg via ORAL
  Filled 2022-10-11 (×4): qty 2

## 2022-10-11 MED ORDER — TRANEXAMIC ACID-NACL 1000-0.7 MG/100ML-% IV SOLN
1000.0000 mg | INTRAVENOUS | Status: AC
  Administered 2022-10-11: 1000 mg via INTRAVENOUS

## 2022-10-11 MED ORDER — BISACODYL 10 MG RE SUPP: 10.0000 mg | Freq: Every day | RECTAL | Status: DC | PRN

## 2022-10-11 MED ORDER — FLEET ENEMA 7-19 GM/118ML RE ENEM: 1.0000 | ENEMA | Freq: Once | RECTAL | Status: DC | PRN

## 2022-10-11 MED ORDER — ACETAMINOPHEN 10 MG/ML IV SOLN
INTRAVENOUS | Status: DC | PRN
  Administered 2022-10-11: 1000 mg via INTRAVENOUS

## 2022-10-11 MED ORDER — PHENOL 1.4 % MT LIQD: 1.0000 | OROMUCOSAL | Status: DC | PRN

## 2022-10-11 MED ORDER — TRANEXAMIC ACID-NACL 1000-0.7 MG/100ML-% IV SOLN
INTRAVENOUS | Status: AC
  Filled 2022-10-11: qty 100

## 2022-10-11 MED ORDER — ADULT MULTIVITAMIN W/MINERALS CH
1.0000 | ORAL_TABLET | Freq: Every day | ORAL | Status: DC
  Administered 2022-10-12: 1 via ORAL
  Filled 2022-10-11: qty 1

## 2022-10-11 MED ORDER — DEXAMETHASONE SODIUM PHOSPHATE 10 MG/ML IJ SOLN: 8.0000 mg | Freq: Once | INTRAMUSCULAR | Status: AC

## 2022-10-11 MED ORDER — PROPOFOL 1000 MG/100ML IV EMUL
INTRAVENOUS | Status: AC
  Filled 2022-10-11: qty 100

## 2022-10-11 MED ORDER — ACETAMINOPHEN 10 MG/ML IV SOLN
1000.0000 mg | Freq: Four times a day (QID) | INTRAVENOUS | Status: AC
  Administered 2022-10-11 – 2022-10-12 (×3): 1000 mg via INTRAVENOUS
  Filled 2022-10-11 (×2): qty 100

## 2022-10-11 MED ORDER — CELECOXIB 200 MG PO CAPS
200.0000 mg | ORAL_CAPSULE | Freq: Two times a day (BID) | ORAL | Status: DC
  Administered 2022-10-11 – 2022-10-12 (×3): 200 mg via ORAL
  Filled 2022-10-11 (×3): qty 1

## 2022-10-11 MED ORDER — OXYCODONE HCL 5 MG PO TABS
5.0000 mg | ORAL_TABLET | ORAL | Status: DC | PRN
  Administered 2022-10-11: 5 mg via ORAL

## 2022-10-11 MED ORDER — CEFAZOLIN SODIUM-DEXTROSE 2-4 GM/100ML-% IV SOLN
2.0000 g | Freq: Four times a day (QID) | INTRAVENOUS | Status: AC
  Administered 2022-10-11: 2 g via INTRAVENOUS
  Filled 2022-10-11 (×2): qty 100

## 2022-10-11 MED ORDER — PROPOFOL 500 MG/50ML IV EMUL
INTRAVENOUS | Status: DC | PRN
  Administered 2022-10-11: 200 ug/kg/min via INTRAVENOUS

## 2022-10-11 MED ORDER — MIDAZOLAM HCL 2 MG/2ML IJ SOLN
INTRAMUSCULAR | Status: AC
  Filled 2022-10-11: qty 2

## 2022-10-11 MED ORDER — DEXAMETHASONE SODIUM PHOSPHATE 10 MG/ML IJ SOLN
INTRAMUSCULAR | Status: AC
  Administered 2022-10-11: 8 mg via INTRAVENOUS
  Filled 2022-10-11: qty 1

## 2022-10-11 MED ORDER — PHENYLEPHRINE HCL-NACL 20-0.9 MG/250ML-% IV SOLN
INTRAVENOUS | Status: DC | PRN
  Administered 2022-10-11: 25 ug/min via INTRAVENOUS

## 2022-10-11 MED ORDER — LACTATED RINGERS IV SOLN: INTRAVENOUS | Status: DC

## 2022-10-11 MED ORDER — BUPIVACAINE HCL (PF) 0.5 % IJ SOLN
INTRAMUSCULAR | Status: DC | PRN
  Administered 2022-10-11: 3 mL

## 2022-10-11 MED ORDER — TRANEXAMIC ACID-NACL 1000-0.7 MG/100ML-% IV SOLN
INTRAVENOUS | Status: AC
  Administered 2022-10-11: 1000 mg via INTRAVENOUS
  Filled 2022-10-11: qty 100

## 2022-10-11 MED ORDER — MENTHOL 3 MG MT LOZG: 1.0000 | LOZENGE | OROMUCOSAL | Status: DC | PRN

## 2022-10-11 MED ORDER — CITALOPRAM HYDROBROMIDE 10 MG PO TABS
10.0000 mg | ORAL_TABLET | Freq: Every day | ORAL | Status: DC
  Administered 2022-10-12: 10 mg via ORAL
  Filled 2022-10-11: qty 1

## 2022-10-11 MED ORDER — OXYCODONE HCL 5 MG PO TABS
10.0000 mg | ORAL_TABLET | ORAL | Status: DC | PRN
  Administered 2022-10-11 – 2022-10-12 (×3): 10 mg via ORAL
  Filled 2022-10-11 (×4): qty 2

## 2022-10-11 MED ORDER — GABAPENTIN 300 MG PO CAPS
ORAL_CAPSULE | ORAL | Status: AC
  Administered 2022-10-11: 300 mg via ORAL
  Filled 2022-10-11: qty 1

## 2022-10-11 MED ORDER — TRAMADOL HCL 50 MG PO TABS: 50.0000 mg | ORAL_TABLET | ORAL | Status: DC | PRN

## 2022-10-11 MED ORDER — DROPERIDOL 2.5 MG/ML IJ SOLN
INTRAMUSCULAR | Status: AC
  Filled 2022-10-11: qty 2

## 2022-10-11 MED ORDER — FAMOTIDINE 20 MG PO TABS: 20.0000 mg | ORAL_TABLET | Freq: Once | ORAL | Status: DC

## 2022-10-11 MED ORDER — DROPERIDOL 2.5 MG/ML IJ SOLN
0.6250 mg | Freq: Once | INTRAMUSCULAR | Status: AC
  Administered 2022-10-11: 0.625 mg via INTRAVENOUS

## 2022-10-11 MED ORDER — SODIUM CHLORIDE 0.9 % IV SOLN: INTRAVENOUS | Status: DC

## 2022-10-11 MED ORDER — DIPHENHYDRAMINE HCL 12.5 MG/5ML PO ELIX: 12.5000 mg | ORAL_SOLUTION | ORAL | Status: DC | PRN

## 2022-10-11 MED ORDER — ONDANSETRON HCL 4 MG PO TABS: 4.0000 mg | ORAL_TABLET | Freq: Four times a day (QID) | ORAL | Status: DC | PRN

## 2022-10-11 MED ORDER — CELECOXIB 200 MG PO CAPS
ORAL_CAPSULE | ORAL | Status: AC
  Administered 2022-10-11: 400 mg via ORAL
  Filled 2022-10-11: qty 2

## 2022-10-11 MED ORDER — ONDANSETRON HCL 4 MG/2ML IJ SOLN: 4.0000 mg | Freq: Four times a day (QID) | INTRAMUSCULAR | Status: DC | PRN

## 2022-10-11 MED ORDER — FENTANYL CITRATE (PF) 100 MCG/2ML IJ SOLN: 25.0000 ug | INTRAMUSCULAR | Status: DC | PRN

## 2022-10-11 MED ORDER — PANTOPRAZOLE SODIUM 40 MG PO TBEC
40.0000 mg | DELAYED_RELEASE_TABLET | Freq: Two times a day (BID) | ORAL | Status: DC
  Administered 2022-10-11 – 2022-10-12 (×2): 40 mg via ORAL
  Filled 2022-10-11 (×2): qty 1

## 2022-10-11 MED ORDER — TRANEXAMIC ACID-NACL 1000-0.7 MG/100ML-% IV SOLN: 1000.0000 mg | Freq: Once | INTRAVENOUS | Status: AC

## 2022-10-11 MED ORDER — LEVOTHYROXINE SODIUM 88 MCG PO TABS
88.0000 ug | ORAL_TABLET | Freq: Every day | ORAL | Status: DC
  Administered 2022-10-12: 88 ug via ORAL
  Filled 2022-10-11: qty 1

## 2022-10-11 MED ORDER — SENNOSIDES-DOCUSATE SODIUM 8.6-50 MG PO TABS
1.0000 | ORAL_TABLET | Freq: Two times a day (BID) | ORAL | Status: DC
  Administered 2022-10-11 – 2022-10-12 (×3): 1 via ORAL
  Filled 2022-10-11 (×3): qty 1

## 2022-10-11 MED ORDER — TRAZODONE HCL 50 MG PO TABS
50.0000 mg | ORAL_TABLET | Freq: Every day | ORAL | Status: DC
  Administered 2022-10-11: 50 mg via ORAL
  Filled 2022-10-11: qty 1

## 2022-10-11 MED ORDER — FERROUS SULFATE 325 (65 FE) MG PO TABS
325.0000 mg | ORAL_TABLET | Freq: Two times a day (BID) | ORAL | Status: DC
  Administered 2022-10-11 – 2022-10-12 (×2): 325 mg via ORAL
  Filled 2022-10-11 (×3): qty 1

## 2022-10-11 MED ORDER — MIDAZOLAM HCL 5 MG/5ML IJ SOLN
INTRAMUSCULAR | Status: DC | PRN
  Administered 2022-10-11: 2 mg via INTRAVENOUS

## 2022-10-11 MED ORDER — SODIUM CHLORIDE (PF) 0.9 % IJ SOLN
INTRAMUSCULAR | Status: DC | PRN
  Administered 2022-10-11: 120 mL via INTRAMUSCULAR

## 2022-10-11 MED ORDER — PROPOFOL 500 MG/50ML IV EMUL: INTRAVENOUS | Status: DC | PRN

## 2022-10-11 MED ORDER — CHLORHEXIDINE GLUCONATE 0.12 % MT SOLN
OROMUCOSAL | Status: AC
  Administered 2022-10-11: 15 mL via OROMUCOSAL
  Filled 2022-10-11: qty 15

## 2022-10-11 MED ORDER — CEFAZOLIN SODIUM-DEXTROSE 2-4 GM/100ML-% IV SOLN
2.0000 g | INTRAVENOUS | Status: AC
  Administered 2022-10-11: 2 g via INTRAVENOUS

## 2022-10-11 MED ORDER — DULOXETINE HCL 30 MG PO CPEP
60.0000 mg | ORAL_CAPSULE | Freq: Every day | ORAL | Status: DC
  Administered 2022-10-12: 60 mg via ORAL
  Filled 2022-10-11: qty 2

## 2022-10-11 MED ORDER — CELECOXIB 200 MG PO CAPS: 400.0000 mg | ORAL_CAPSULE | Freq: Once | ORAL | Status: AC

## 2022-10-11 MED ORDER — ALUM & MAG HYDROXIDE-SIMETH 200-200-20 MG/5ML PO SUSP: 30.0000 mL | ORAL | Status: DC | PRN

## 2022-10-11 MED ORDER — BUPIVACAINE HCL (PF) 0.25 % IJ SOLN
INTRAMUSCULAR | Status: AC
  Filled 2022-10-11: qty 120

## 2022-10-11 MED ORDER — MAGNESIUM HYDROXIDE 400 MG/5ML PO SUSP
30.0000 mL | Freq: Every day | ORAL | Status: DC
  Administered 2022-10-11 – 2022-10-12 (×2): 30 mL via ORAL
  Filled 2022-10-11 (×2): qty 30

## 2022-10-11 MED ORDER — ORAL CARE MOUTH RINSE: 15.0000 mL | Freq: Once | OROMUCOSAL | Status: AC

## 2022-10-11 MED ORDER — ENSURE PRE-SURGERY PO LIQD
296.0000 mL | Freq: Once | ORAL | Status: AC
  Administered 2022-10-11: 296 mL via ORAL
  Filled 2022-10-11: qty 296

## 2022-10-11 MED ORDER — FAMOTIDINE 20 MG PO TABS
ORAL_TABLET | ORAL | Status: AC
  Administered 2022-10-11: 20 mg
  Filled 2022-10-11: qty 1

## 2022-10-11 MED ORDER — GABAPENTIN 300 MG PO CAPS: 300.0000 mg | ORAL_CAPSULE | Freq: Once | ORAL | Status: AC

## 2022-10-11 MED ORDER — CEFAZOLIN SODIUM-DEXTROSE 2-4 GM/100ML-% IV SOLN
INTRAVENOUS | Status: AC
  Administered 2022-10-11: 2 g via INTRAVENOUS
  Filled 2022-10-11: qty 100

## 2022-10-11 SURGICAL SUPPLY — 70 items
ATTUNE PSFEM LTSZ5 NARCEM KNEE (Femur) IMPLANT
ATTUNE PSRP INSR SZ 5 10M KNEE (Insert) IMPLANT
BASEPLATE TIBIAL ROTATING SZ 4 (Knees) IMPLANT
BATTERY INSTRU NAVIGATION (MISCELLANEOUS) ×4 IMPLANT
BLADE SAW 70X12.5 (BLADE) ×1 IMPLANT
BLADE SAW 90X13X1.19 OSCILLAT (BLADE) ×1 IMPLANT
BLADE SAW 90X25X1.19 OSCILLAT (BLADE) ×1 IMPLANT
BONE CEMENT GENTAMICIN (Cement) ×2 IMPLANT
CEMENT BONE GENTAMICIN 40 (Cement) IMPLANT
COOLER POLAR GLACIER W/PUMP (MISCELLANEOUS) ×1 IMPLANT
CUFF TOURN SGL QUICK 24 (TOURNIQUET CUFF)
CUFF TOURN SGL QUICK 34 (TOURNIQUET CUFF)
CUFF TRNQT CYL 24X4X16.5-23 (TOURNIQUET CUFF) IMPLANT
CUFF TRNQT CYL 34X4.125X (TOURNIQUET CUFF) IMPLANT
DRAPE 3/4 80X56 (DRAPES) ×1 IMPLANT
DRAPE INCISE IOBAN 66X45 STRL (DRAPES) IMPLANT
DRSG DERMACEA NONADH 3X8 (GAUZE/BANDAGES/DRESSINGS) ×1 IMPLANT
DRSG MEPILEX SACRM 8.7X9.8 (GAUZE/BANDAGES/DRESSINGS) ×1 IMPLANT
DRSG OPSITE POSTOP 4X14 (GAUZE/BANDAGES/DRESSINGS) ×1 IMPLANT
DRSG TEGADERM 4X4.75 (GAUZE/BANDAGES/DRESSINGS) ×1 IMPLANT
DURAPREP 26ML APPLICATOR (WOUND CARE) ×2 IMPLANT
ELECT CAUTERY BLADE 6.4 (BLADE) ×1 IMPLANT
ELECT REM PT RETURN 9FT ADLT (ELECTROSURGICAL) ×1
ELECTRODE REM PT RTRN 9FT ADLT (ELECTROSURGICAL) ×1 IMPLANT
EX-PIN ORTHOLOCK NAV 4X150 (PIN) ×2 IMPLANT
GLOVE BIOGEL M STRL SZ7.5 (GLOVE) ×2 IMPLANT
GLOVE BIOGEL PI IND STRL 7.5 (GLOVE) ×1 IMPLANT
GLOVE PI ORTHO PRO STRL 7.5 (GLOVE) ×2 IMPLANT
GLOVE SURG UNDER POLY LF SZ7.5 (GLOVE) ×1 IMPLANT
GOWN STRL REUS W/ TWL LRG LVL3 (GOWN DISPOSABLE) ×2 IMPLANT
GOWN STRL REUS W/ TWL XL LVL3 (GOWN DISPOSABLE) ×1 IMPLANT
GOWN STRL REUS W/TWL LRG LVL3 (GOWN DISPOSABLE) ×2
GOWN STRL REUS W/TWL XL LVL3 (GOWN DISPOSABLE) ×1
HEMOVAC 400CC 10FR (MISCELLANEOUS) ×1 IMPLANT
HOLDER FOLEY CATH W/STRAP (MISCELLANEOUS) ×1 IMPLANT
HOOD PEEL AWAY T7 (MISCELLANEOUS) ×2 IMPLANT
IV NS IRRIG 3000ML ARTHROMATIC (IV SOLUTION) ×1 IMPLANT
KIT TURNOVER KIT A (KITS) ×1 IMPLANT
KNIFE SCULPS 14X20 (INSTRUMENTS) ×1 IMPLANT
MANIFOLD NEPTUNE II (INSTRUMENTS) ×2 IMPLANT
NDL SPNL 20GX3.5 QUINCKE YW (NEEDLE) ×2 IMPLANT
NEEDLE SPNL 20GX3.5 QUINCKE YW (NEEDLE) ×2 IMPLANT
NS IRRIG 500ML POUR BTL (IV SOLUTION) ×1 IMPLANT
PACK TOTAL KNEE (MISCELLANEOUS) ×1 IMPLANT
PAD ABD DERMACEA PRESS 5X9 (GAUZE/BANDAGES/DRESSINGS) ×2 IMPLANT
PAD WRAPON POLAR KNEE (MISCELLANEOUS) ×1 IMPLANT
PATELLA MEDIAL ATTUN 35MM KNEE (Knees) IMPLANT
PIN DRILL FIX HALF THREAD (BIT) ×2 IMPLANT
PIN FIXATION 1/8DIA X 3INL (PIN) ×1 IMPLANT
PULSAVAC PLUS IRRIG FAN TIP (DISPOSABLE) ×1
SOL PREP PVP 2OZ (MISCELLANEOUS) ×1
SOLUTION IRRIG SURGIPHOR (IV SOLUTION) ×1 IMPLANT
SOLUTION PREP PVP 2OZ (MISCELLANEOUS) ×1 IMPLANT
SPONGE DRAIN TRACH 4X4 STRL 2S (GAUZE/BANDAGES/DRESSINGS) ×1 IMPLANT
STAPLER SKIN PROX 35W (STAPLE) ×1 IMPLANT
STOCKINETTE IMPERV 14X48 (MISCELLANEOUS) IMPLANT
STRAP TIBIA SHORT (MISCELLANEOUS) ×1 IMPLANT
SUCTION FRAZIER HANDLE 10FR (MISCELLANEOUS) ×1
SUCTION TUBE FRAZIER 10FR DISP (MISCELLANEOUS) ×1 IMPLANT
SUT VIC AB 0 CT1 36 (SUTURE) ×2 IMPLANT
SUT VIC AB 1 CT1 36 (SUTURE) ×2 IMPLANT
SUT VIC AB 2-0 CT2 27 (SUTURE) ×1 IMPLANT
SYR 30ML LL (SYRINGE) ×2 IMPLANT
TIP FAN IRRIG PULSAVAC PLUS (DISPOSABLE) ×1 IMPLANT
TOWEL OR 17X26 4PK STRL BLUE (TOWEL DISPOSABLE) IMPLANT
TOWER CARTRIDGE SMART MIX (DISPOSABLE) ×1 IMPLANT
TRAP FLUID SMOKE EVACUATOR (MISCELLANEOUS) ×1 IMPLANT
TRAY FOLEY MTR SLVR 16FR STAT (SET/KITS/TRAYS/PACK) ×1 IMPLANT
WATER STERILE IRR 1000ML POUR (IV SOLUTION) ×1 IMPLANT
WRAPON POLAR PAD KNEE (MISCELLANEOUS) ×1

## 2022-10-11 NOTE — Op Note (Signed)
OPERATIVE NOTE  DATE OF SURGERY:  10/11/2022  PATIENT NAME:  Barbara Underwood   DOB: Feb 25, 1956  MRN: [id number]  PRE-OPERATIVE DIAGNOSIS: Degenerative arthrosis of the left knee, primary  POST-OPERATIVE DIAGNOSIS:  Same  PROCEDURE:  Left total knee arthroplasty using computer-assisted navigation  SURGEON:  Marciano Sequin. M.D.  ASSISTANT: Cassell Smiles, PA-C (present and scrubbed throughout the case, critical for assistance with exposure, retraction, instrumentation, and closure)  ANESTHESIA: spinal  ESTIMATED BLOOD LOSS: 50 mL  FLUIDS REPLACED: 700 mL of crystalloid  TOURNIQUET TIME: 74 minutes  DRAINS: 2 medium Hemovac drains  SOFT TISSUE RELEASES: Anterior cruciate ligament, posterior cruciate ligament, deep medial collateral ligament, patellofemoral ligament  IMPLANTS UTILIZED: DePuy Attune size 5N posterior stabilized femoral component (cemented), size 4 rotating platform tibial component (cemented), 35 mm medialized dome patella (cemented), and a 10 mm stabilized rotating platform polyethylene insert.  INDICATIONS FOR SURGERY: Barbara Underwood is a 65 y.o. year old female with a long history of progressive knee pain. X-rays demonstrated severe degenerative changes in tricompartmental fashion. The patient had not seen any significant improvement despite conservative nonsurgical intervention. After discussion of the risks and benefits of surgical intervention, the patient expressed understanding of the risks benefits and agree with plans for total knee arthroplasty.   The risks, benefits, and alternatives were discussed at length including but not limited to the risks of infection, bleeding, nerve injury, stiffness, blood clots, the need for revision surgery, cardiopulmonary complications, among others, and they were willing to proceed.  PROCEDURE IN DETAIL: The patient was brought into the operating room and, after adequate spinal anesthesia was achieved, a tourniquet was placed on  the patient's upper thigh. The patient's knee and leg were cleaned and prepped with alcohol and DuraPrep and draped in the usual sterile fashion. A "timeout" was performed as per usual protocol. The lower extremity was exsanguinated using an Esmarch, and the tourniquet was inflated to 300 mmHg. An anterior longitudinal incision was made followed by a standard mid vastus approach. The deep fibers of the medial collateral ligament were elevated in a subperiosteal fashion off of the medial flare of the tibia so as to maintain a continuous soft tissue sleeve. The patella was subluxed laterally and the patellofemoral ligament was incised. Inspection of the knee demonstrated severe degenerative changes with full-thickness loss of articular cartilage. Osteophytes were debrided using a rongeur. Anterior and posterior cruciate ligaments were excised. Two 4.0 mm Schanz pins were inserted in the femur and into the tibia for attachment of the array of trackers used for computer-assisted navigation. Hip center was identified using a circumduction technique. Distal landmarks were mapped using the computer. The distal femur and proximal tibia were mapped using the computer. The distal femoral cutting guide was positioned using computer-assisted navigation so as to achieve a 5 distal valgus cut. The femur was sized and it was felt that a size 5N femoral component was appropriate. A size 5 femoral cutting guide was positioned and the anterior cut was performed and verified using the computer. This was followed by completion of the posterior and chamfer cuts. Femoral cutting guide for the central box was then positioned in the center box cut was performed.  Attention was then directed to the proximal tibia. Medial and lateral menisci were excised. The extramedullary tibial cutting guide was positioned using computer-assisted navigation so as to achieve a 0 varus-valgus alignment and 3 posterior slope. The cut was performed and  verified using the computer. The proximal tibia was sized and  it was felt that a size 4 tibial tray was appropriate. Tibial and femoral trials were inserted followed by insertion of a 10 mm polyethylene insert. This allowed for excellent mediolateral soft tissue balancing both in flexion and in full extension. Finally, the patella was cut and prepared so as to accommodate a 35 mm medialized dome patella. A patella trial was placed and the knee was placed through a range of motion with excellent patellar tracking appreciated. The femoral trial was removed after debridement of posterior osteophytes. The central post-hole for the tibial component was reamed followed by insertion of a keel punch. Tibial trials were then removed. Cut surfaces of bone were irrigated with copious amounts of normal saline using pulsatile lavage and then suctioned dry. Polymethylmethacrylate cement with gentamicin was prepared in the usual fashion using a vacuum mixer. Cement was applied to the cut surface of the proximal tibia as well as along the undersurface of a size 4 rotating platform tibial component. Tibial component was positioned and impacted into place. Excess cement was removed using Civil [employer] fast streamer. Cement was then applied to the cut surfaces of the femur as well as along the posterior flanges of the size 5N femoral component. The femoral component was positioned and impacted into place. Excess cement was removed using Civil Service fast streamer. A 10 mm polyethylene trial was inserted and the knee was brought into full extension with steady axial compression applied. Finally, cement was applied to the backside of a 35 mm medialized dome patella and the patellar component was positioned and patellar clamp applied. Excess cement was removed using [employer] fast streamer. After adequate curing of the cement, the tourniquet was deflated after a total tourniquet time of 74 minutes. Hemostasis was achieved using electrocautery. The knee was irrigated  with copious amounts of normal saline using pulsatile lavage followed by 450 ml of Surgiphor and then suctioned dry. 20 mL of 1.3% Exparel and 60 mL of 0.25% Marcaine in 40 mL of normal saline was injected along the posterior capsule, medial and lateral gutters, and along the arthrotomy site. A 10 mm stabilized rotating platform polyethylene insert was inserted and the knee was placed through a range of motion with excellent mediolateral soft tissue balancing appreciated and excellent patellar tracking noted. 2 medium drains were placed in the wound bed and brought out through separate stab incisions. The medial parapatellar portion of the incision was reapproximated using interrupted sutures of #1 Vicryl. Subcutaneous tissue was approximated in layers using first #0 Vicryl followed #2-0 Vicryl. The skin was approximated with skin staples. A sterile dressing was applied.  The patient tolerated the procedure well and was transported to the recovery room in stable condition.    Teirra Carapia P. Holley Bouche., M.D.

## 2022-10-11 NOTE — Anesthesia Procedure Notes (Signed)
Date/Time: 10/11/2022 8:50 AM  Performed by: Nelda Marseille, CRNAPre-anesthesia Checklist: Patient identified, Emergency Drugs available, Suction available, Patient being monitored and Timeout performed Oxygen Delivery Method: Simple face mask

## 2022-10-11 NOTE — Evaluation (Addendum)
Physical Therapy Evaluation Patient Details Name: Barbara Underwood MRN: [id number] DOB: 01-21-1956 Today's Date: 10/11/2022  History of Present Illness  66 y.o. female s/p left TKA. PMH includes hypothyroidism, sleep apnea.  Clinical Impression  Pt found supine in bed upon PT entry. Pt performed bed level exercises and was motivated to participate in therapy session. Supine<>Sit with supervision and sit<>stand with min assist and RW. Pt educated on RW mechanics and WBAT precautions. Pt performed stand pivot transfer from standing to [facility] with CGA and VC for hand placement with RW. BSC to recliner stand pivot transfer CGA with min VC for safety and sequencing. Pt would benefit from skilled physical therapy to address listed deficits (see below) to increase functionality with ADLs and safety. Current recommendation is HHPT pending stair education.      Recommendations for follow up therapy are one component of a multi-disciplinary discharge planning process, led by the attending physician.  Recommendations may be updated based on patient status, additional functional criteria and insurance authorization.  Follow Up Recommendations Home health PT      Assistance Recommended at Discharge Intermittent Supervision/Assistance  Patient can return home with the following  A little help with walking and/or transfers;A little help with bathing/dressing/bathroom;Assistance with cooking/housework;Assist for transportation;Help with stairs or ramp for entrance    Equipment Recommendations Rolling walker (2 wheels);BSC/3in1  Recommendations for Other Services  OT consult    Functional Status Assessment Patient has had a recent decline in their functional status and demonstrates the ability to make significant improvements in function in a reasonable and predictable amount of time.     Precautions / Restrictions Precautions Precautions: Knee;Fall Precaution Booklet Issued: Yes (comment) Precaution  Comments: WBAT Restrictions Weight Bearing Restrictions: Yes LLE Weight Bearing: Weight bearing as tolerated      Mobility  Bed Mobility Overal bed mobility: Needs Assistance Bed Mobility: Sidelying to Sit   Sidelying to sit: Min guard            Transfers Overall transfer level: Needs assistance Equipment used: Rolling walker (2 wheels) Transfers: Sit to/from Stand Sit to Stand: Min assist                Ambulation/Gait     Assistive device: Rolling walker (2 wheels) Gait Pattern/deviations: Step-to pattern, Decreased step length - right, Decreased step length - left, Decreased stance time - right, Decreased stance time - left, Decreased stride length, Decreased dorsiflexion - right, Decreased dorsiflexion - left, Decreased weight shift to left       General Gait Details: decreased weight shift to left side due to 5/10 pain in left knee s/p TKA.  Stairs            Wheelchair Mobility    Modified Rankin (Stroke Patients Only)       Balance Overall balance assessment: Needs assistance Sitting-balance support: Bilateral upper extremity supported Sitting balance-Leahy Scale: Good     Standing balance support: Bilateral upper extremity supported Standing balance-Leahy Scale: Fair                               Pertinent Vitals/Pain Pain Assessment Pain Assessment: No/denies pain (by end of session 5/10 pain in left knee s/p TKA)    Home Living Family/patient expects to be discharged to:: Private residence Living Arrangements: Spouse/significant other   Type of Home: House Home Access: Stairs to enter Entrance Stairs-Rails: Right;Left;Can reach both Entrance Stairs-Number of Steps: 3  Home Layout: One level Home Equipment: Cane - single point      Prior Function Prior Level of Function : Independent/Modified Independent                     Hand Dominance        Extremity/Trunk Assessment   Upper Extremity  Assessment Upper Extremity Assessment: Overall WFL for tasks assessed    Lower Extremity Assessment Lower Extremity Assessment: Generalized weakness    Cervical / Trunk Assessment Cervical / Trunk Assessment: Normal  Communication   Communication: No difficulties  Cognition Arousal/Alertness: Awake/alert   Overall Cognitive Status: Within Functional Limits for tasks assessed                                          General Comments      Exercises Total Joint Exercises Ankle Circles/Pumps: AROM, Right, Left, Strengthening, 10 reps, Supine Quad Sets: AROM, Strengthening, Right, Left, 10 reps, Supine Heel Slides: AROM, Right, Left, 10 reps, Supine   Assessment/Plan    PT Assessment Patient needs continued PT services  PT Problem List Decreased strength;Decreased mobility;Decreased range of motion;Decreased activity tolerance;Decreased balance;Decreased knowledge of use of DME;Pain       PT Treatment Interventions DME instruction;Therapeutic exercise;Gait training;Balance training;Stair training;Neuromuscular re-education;Functional mobility training;Therapeutic activities;Patient/family education    PT Goals (Current goals can be found in the Care Plan section)  Acute Rehab PT Goals Patient Stated Goal: to return home PT Goal Formulation: With patient Time For Goal Achievement: 10/25/22 Potential to Achieve Goals: Good    Frequency BID     Co-evaluation               AM-PAC PT "6 Clicks" Mobility  Outcome Measure Help needed turning from your back to your side while in a flat bed without using bedrails?: None Help needed moving from lying on your back to sitting on the side of a flat bed without using bedrails?: None Help needed moving to and from a bed to a chair (including a wheelchair)?: A Little Help needed standing up from a chair using your arms (e.g., wheelchair or bedside chair)?: A Little Help needed to walk in hospital room?: A  Little Help needed climbing 3-5 steps with a railing? : A Lot 6 Click Score: 19    End of Session Equipment Utilized During Treatment: Gait belt Activity Tolerance: Patient tolerated treatment well Patient left: in chair;with call bell/phone within reach;with chair alarm set Nurse Communication: Mobility status PT Visit Diagnosis: Unsteadiness on feet (R26.81);Other abnormalities of gait and mobility (R26.89);Muscle weakness (generalized) (M62.81);Pain Pain - Right/Left: Left Pain - part of body: Knee    Time: 2542-7062 PT Time Calculation (min) (ACUTE ONLY): 35 min   Charges:                Claiborne Billings O'Daniel, SPT 10/11/2022, 4:14 PM

## 2022-10-11 NOTE — Anesthesia Preprocedure Evaluation (Signed)
Anesthesia Evaluation  Patient identified by MRN, date of birth, ID band Patient awake    Reviewed: Allergy & Precautions, H&P , NPO status , Patient's Chart, lab work & pertinent test results, reviewed documented beta blocker date and time   History of Anesthesia Complications Negative for: history of anesthetic complications  Airway Mallampati: II  TM Distance: >3 FB Neck ROM: full    Dental  (+) Caps, Implants, Dental Advidsory Given   Pulmonary neg shortness of breath, sleep apnea and Continuous Positive Airway Pressure Ventilation , neg COPD, neg recent URI,    Pulmonary exam normal breath sounds clear to auscultation       Cardiovascular Exercise Tolerance: Good negative cardio ROS Normal cardiovascular exam Rhythm:regular Rate:Normal     Neuro/Psych  Headaches, PSYCHIATRIC DISORDERS Anxiety Depression    GI/Hepatic negative GI ROS, Neg liver ROS,   Endo/Other  Hypothyroidism   Renal/GU negative Renal ROS  negative genitourinary   Musculoskeletal   Abdominal   Peds  Hematology negative hematology ROS (+)   Anesthesia Other Findings Past Medical History: No date: Angiomyolipoma of right kidney     Comment:  stable No date: Anxiety No date: Depression No date: Hypothyroidism No date: Mitral valve prolapse     Comment:  Told she had as child, now told VERY mild, no need for               prophylaxis No date: Motion sickness     Comment:  car rides No date: Sleep apnea   Reproductive/Obstetrics negative OB ROS                             Anesthesia Physical Anesthesia Plan  ASA: 2  Anesthesia Plan: Spinal   Post-op Pain Management:    Induction:   PONV Risk Score and Plan: 3 and Propofol infusion and TIVA  Airway Management Planned: Natural Airway and Simple Face Mask  Additional Equipment:   Intra-op Plan:   Post-operative Plan:   Informed Consent: I have  reviewed the patients History and Physical, chart, labs and discussed the procedure including the risks, benefits and alternatives for the proposed anesthesia with the patient or authorized representative who has indicated his/her understanding and acceptance.     Dental Advisory Given  Plan Discussed with: Anesthesiologist, CRNA and Surgeon  Anesthesia Plan Comments:         Anesthesia Quick Evaluation

## 2022-10-11 NOTE — Anesthesia Procedure Notes (Signed)
Spinal  Patient location during procedure: OR Start time: 10/11/2022 8:27 AM End time: 10/11/2022 8:30 AM Reason for block: surgical anesthesia Staffing Performed: resident/CRNA  Resident/CRNA: Nelda Marseille, CRNA Performed by: Nelda Marseille, CRNA Authorized by: Martha Clan, MD   Preanesthetic Checklist Completed: patient identified, IV checked, site marked, risks and benefits discussed, surgical consent, monitors and equipment checked, pre-op evaluation and timeout performed Spinal Block Patient position: sitting Prep: ChloraPrep Patient monitoring: heart rate, continuous pulse ox, blood pressure and cardiac monitor Approach: midline Location: L3-4 Injection technique: single-shot Needle Needle type: Whitacre and Introducer  Needle gauge: 25 G Needle length: 9 cm Assessment Sensory level: T10 Events: CSF return Additional Notes Sterile aseptic technique used throughout the procedure.  Negative paresthesia. Negative blood return. Positive free-flowing CSF. Expiration date of kit checked and confirmed. Patient tolerated procedure well, without complications.

## 2022-10-11 NOTE — Plan of Care (Signed)
  Problem: Education: Goal: Knowledge of the prescribed therapeutic regimen will improve Outcome: Progressing   Problem: Activity: Goal: Ability to avoid complications of mobility impairment will improve Outcome: Progressing   Problem: Pain Management: Goal: Pain level will decrease with appropriate interventions Outcome: Progressing   Problem: Education: Goal: Knowledge of General Education information will improve Description: Including pain rating scale, medication(s)/side effects and non-pharmacologic comfort measures Outcome: Progressing

## 2022-10-11 NOTE — Transfer of Care (Signed)
Immediate Anesthesia Transfer of Care Note  Patient: Barbara Underwood  Procedure(s) Performed: TOTAL KNEE ARTHROPLASTY (Left: Knee)  Patient Location: PACU  Anesthesia Type:Spinal  Level of Consciousness: awake, alert  and oriented  Airway & Oxygen Therapy: Patient Spontanous Breathing and Patient connected to face mask oxygen  Post-op Assessment: Report given to RN and Post -op Vital signs reviewed and stable  Post vital signs: Reviewed and stable  Last Vitals:  Vitals Value Taken Time  BP    Temp    Pulse    Resp    SpO2      Last Pain:  Vitals:   10/11/22 0637  TempSrc: Oral  PainSc: 1          Complications: No notable events documented.

## 2022-10-11 NOTE — Interval H&P Note (Signed)
History and Physical Interval Note:  10/11/2022 6:23 AM  Barbara Underwood  has presented today for surgery, with the diagnosis of PRIMARY OSTEOARTHRITIS OF LEFT KNEE..  The various methods of treatment have been discussed with the patient and family. After consideration of risks, benefits and other options for treatment, the patient has consented to  Procedure(s): TOTAL KNEE ARTHROPLASTY (Left) as a surgical intervention.  The patient's history has been reviewed, patient examined, no change in status, stable for surgery.  I have reviewed the patient's chart and labs.  Questions were answered to the patient's satisfaction.     [place]

## 2022-10-12 ENCOUNTER — Encounter: Payer: Self-pay | Admitting: Orthopedic Surgery

## 2022-10-12 DIAGNOSIS — M1712 Unilateral primary osteoarthritis, left knee: Secondary | ICD-10-CM | POA: Diagnosis not present

## 2022-10-12 MED ORDER — BUPROPION HCL ER (XL) 300 MG PO TB24: 300.0000 mg | ORAL_TABLET | Freq: Every day | ORAL | Status: AC

## 2022-10-12 MED ORDER — OXYCODONE HCL 5 MG PO TABS: 5.0000 mg | ORAL_TABLET | ORAL | 0 refills | Status: AC | PRN

## 2022-10-12 MED ORDER — CELECOXIB 200 MG PO CAPS: 200.0000 mg | ORAL_CAPSULE | Freq: Two times a day (BID) | ORAL | 0 refills | Status: AC

## 2022-10-12 MED ORDER — DOCUSATE SODIUM 100 MG PO CAPS: 100.0000 mg | ORAL_CAPSULE | Freq: Two times a day (BID) | ORAL | 0 refills | Status: AC

## 2022-10-12 MED ORDER — TRAMADOL HCL 50 MG PO TABS: 50.0000 mg | ORAL_TABLET | ORAL | 0 refills | Status: AC | PRN

## 2022-10-12 MED ORDER — ENOXAPARIN SODIUM 40 MG/0.4ML IJ SOSY: 40.0000 mg | PREFILLED_SYRINGE | INTRAMUSCULAR | 0 refills | Status: AC

## 2022-10-12 NOTE — Plan of Care (Signed)
  Problem: Clinical Measurements: Goal: Ability to maintain clinical measurements within normal limits will improve Outcome: Progressing   Problem: Health Behavior/Discharge Planning: Goal: Ability to manage health-related needs will improve Outcome: Progressing   Problem: Clinical Measurements: Goal: Will remain free from infection Outcome: Progressing   Problem: Nutrition: Goal: Adequate nutrition will be maintained Outcome: Progressing   Problem: Coping: Goal: Level of anxiety will decrease Outcome: Progressing   Problem: Safety: Goal: Ability to remain free from injury will improve Outcome: Progressing   Problem: Skin Integrity: Goal: Risk for impaired skin integrity will decrease Outcome: Progressing

## 2022-10-12 NOTE — Progress Notes (Addendum)
Physical Therapy Treatment Patient Details Name: Barbara Underwood MRN: [id number] DOB: March 29, 1956 Today's Date: 10/12/2022   History of Present Illness 66 y.o. female s/p left TKA. PMH includes hypothyroidism, sleep apnea.    PT Comments    Pt found in recliner upon PT entry with c/o 6/10 pain in left knee s/p TKA. Pt educated on WBAT precautions. Sit<>stand with CGA. Pt ambulated 375 ft with CGA, RW, and required multiple standing breaks due to pain in left knee (7/10). Pt ascended/descended 4 steps with CGA and bilateral UE supported on unilateral right railing. Pt would benefit from skilled physical therapy to address the listed deficits (see below) to increase independence with ADLs and functionality. Current recommendation is HHPT to return to PLOF.    Recommendations for follow up therapy are one component of a multi-disciplinary discharge planning process, led by the attending physician.  Recommendations may be updated based on patient status, additional functional criteria and insurance authorization.  Follow Up Recommendations  Home health PT     Assistance Recommended at Discharge Intermittent Supervision/Assistance  Patient can return home with the following A little help with walking and/or transfers;A little help with bathing/dressing/bathroom;Assistance with cooking/housework;Assist for transportation;Help with stairs or ramp for entrance   Equipment Recommendations  Rolling walker (2 wheels);BSC/3in1    Recommendations for Other Services       Precautions / Restrictions Precautions Precautions: Knee;Fall Precaution Booklet Issued: No Precaution Comments: WBAT Restrictions Weight Bearing Restrictions: Yes LLE Weight Bearing: Weight bearing as tolerated     Mobility  Bed Mobility Overal bed mobility: Needs Assistance Bed Mobility: Supine to Sit     Supine to sit: Supervision          Transfers Overall transfer level: Needs assistance Equipment used:  Rolling walker (2 wheels) Transfers: Sit to/from Stand Sit to Stand: Min guard                Ambulation/Gait Ambulation/Gait assistance: Min guard Gait Distance (Feet): 500 Feet Assistive device: Rolling walker (2 wheels) Gait Pattern/deviations: Step-to pattern, Decreased step length - right, Decreased step length - left, Decreased stance time - right, Decreased stance time - left, Decreased stride length, Decreased dorsiflexion - right, Decreased dorsiflexion - left, Decreased weight shift to left       General Gait Details: pt able to bare more weight in LLE today and educated on gait mechanics   Stairs Stairs: Yes Stairs assistance: Min guard Stair Management: One rail Right, Step to pattern Number of Stairs: 4 General stair comments: pt educated on using bilateral UE on right side railing and "up with good, down with bad"   Wheelchair Mobility    Modified Rankin (Stroke Patients Only)       Balance Overall balance assessment: Needs assistance Sitting-balance support: No upper extremity supported, Feet supported Sitting balance-Leahy Scale: Good     Standing balance support: Bilateral upper extremity supported Standing balance-Leahy Scale: Good                              Cognition Arousal/Alertness: Awake/alert Behavior During Therapy: WFL for tasks assessed/performed Overall Cognitive Status: Within Functional Limits for tasks assessed                                          Exercises Other Exercises Other Exercises: left knee flexion assessed approx 75  degrees post PT session    General Comments        Pertinent Vitals/Pain Pain Assessment Pain Score: 6  Pain Location: L knee Pain Descriptors / Indicators: Aching, Discomfort, Dull, Grimacing Pain Intervention(s): Monitored during session, Repositioned, Ice applied    Home Living Family/patient expects to be discharged to:: Private residence Living  Arrangements: Spouse/significant other Available Help at Discharge: Family Type of Home: House Home Access: Stairs to enter Entrance Stairs-Rails: Can reach both Entrance Stairs-Number of Steps: 3   Home Layout: One level Home Equipment: Cane - single point;Shower seat - built in      Prior Function            PT Goals (current goals can now be found in the care plan section) Acute Rehab PT Goals Patient Stated Goal: to return home PT Goal Formulation: With patient Time For Goal Achievement: 10/25/22 Potential to Achieve Goals: Good Progress towards PT goals: Progressing toward goals    Frequency    BID      PT Plan Current plan remains appropriate    Co-evaluation              AM-PAC PT "6 Clicks" Mobility   Outcome Measure  Help needed turning from your back to your side while in a flat bed without using bedrails?: None Help needed moving from lying on your back to sitting on the side of a flat bed without using bedrails?: None Help needed moving to and from a bed to a chair (including a wheelchair)?: A Little Help needed standing up from a chair using your arms (e.g., wheelchair or bedside chair)?: None Help needed to walk in hospital room?: A Little Help needed climbing 3-5 steps with a railing? : A Little 6 Click Score: 21    End of Session Equipment Utilized During Treatment: Gait belt Activity Tolerance: Patient tolerated treatment well Patient left: in chair;with call bell/phone within reach;with chair alarm set Nurse Communication: Mobility status PT Visit Diagnosis: Unsteadiness on feet (R26.81);Other abnormalities of gait and mobility (R26.89);Muscle weakness (generalized) (M62.81);Pain Pain - Right/Left: Left Pain - part of body: Knee     Time: 5284-1324 PT Time Calculation (min) (ACUTE ONLY): 32 min  Charges:                           Claiborne Billings O'Daniel, SPT 10/12/2022, 10:22 AM

## 2022-10-12 NOTE — Progress Notes (Signed)
Patient given discharge instruction and verbalized understand. 3 in 1 and front wheel walker sent with patient. Patient wheeled to car by staff. Patient husband provided transportation

## 2022-10-12 NOTE — Progress Notes (Signed)
PT Cancellation Note  Patient Details Name: Barbara Underwood MRN: [id number] DOB: 1955/12/14   Cancelled Treatment:    Reason Eval/Treat Not Completed: Other (comment) Pt eating breakfast, asked PT to return. PT to reattempt as able   Aletha Halim, SPT 10/12/2022, 8:47 AM

## 2022-10-12 NOTE — Anesthesia Postprocedure Evaluation (Signed)
Anesthesia Post Note  Patient: Barbara Underwood  Procedure(s) Performed: TOTAL KNEE ARTHROPLASTY (Left: Knee)  Patient location during evaluation: Nursing Unit Anesthesia Type: Spinal Level of consciousness: oriented and awake and alert Pain management: pain level controlled Vital Signs Assessment: post-procedure vital signs reviewed and stable Respiratory status: spontaneous breathing and respiratory function stable Cardiovascular status: blood pressure returned to baseline and stable Postop Assessment: no headache, no backache, no apparent nausea or vomiting and patient able to bend at knees Anesthetic complications: no   No notable events documented.   Last Vitals:  Vitals:   10/12/22 0010 10/12/22 0518  BP: (!) 140/65 130/64  Pulse: 87 83  Resp: 17 17  Temp: 36.6 C 36.7 C  SpO2: 98% 99%    Last Pain:  Vitals:   10/12/22 0529  TempSrc:   PainSc: 3                  Lysbeth Dicola Lorenza Chick

## 2022-10-12 NOTE — Discharge Summary (Addendum)
Physician Discharge Summary  Patient ID: Barbara Underwood MRN: [id number] DOB/AGE: [birth year] 66 y.o.  Admit date: 10/11/2022 Discharge date: 10/12/2022  Admission Diagnoses:  Total knee replacement status [Z96.659]  Surgeries:Procedure(s):  Left total knee arthroplasty using computer-assisted navigation   SURGEON:  Marciano Sequin. M.D.   ASSISTANT: Cassell Smiles, PA-C (present and scrubbed throughout the case, critical for assistance with exposure, retraction, instrumentation, and closure)   ANESTHESIA: spinal   ESTIMATED BLOOD LOSS: 50 mL   FLUIDS REPLACED: 700 mL of crystalloid   TOURNIQUET TIME: 74 minutes   DRAINS: 2 medium Hemovac drains   SOFT TISSUE RELEASES: Anterior cruciate ligament, posterior cruciate ligament, deep medial collateral ligament, patellofemoral ligament   IMPLANTS UTILIZED: DePuy Attune size 5N posterior stabilized femoral component (cemented), size 4 rotating platform tibial component (cemented), 35 mm medialized dome patella (cemented), and a 10 mm stabilized rotating platform polyethylene insert.  Discharge Diagnoses: Patient Active Problem List   Diagnosis Date Noted   Total knee replacement status 10/11/2022   Derangement of left shoulder joint 08/12/2021   Pain in joint of left shoulder 08/11/2021   OSA (obstructive sleep apnea) 01/04/2021   Depression, major, in remission ([place]) 01/08/2020   Hypercholesterolemia 01/08/2020   Prediabetes 01/08/2020   OA (osteoarthritis) of knee 08/29/2019   Headache, migraine 06/30/2015   Adult hypothyroidism 06/30/2015   Anxiety and depression 06/30/2015   Benign neoplasm 06/30/2015   Billowing mitral valve 06/30/2015   Severe obesity (BMI 35.0-39.9) with comorbidity ([place]) 06/30/2015   Kidney lump 06/30/2015    Past Medical History:  Diagnosis Date   Angiomyolipoma of right kidney    stable   Anxiety    Depression    Hypothyroidism    Mitral valve prolapse    Told she had as child, now told VERY  mild, no need for prophylaxis   Motion sickness    car rides   Sleep apnea      Transfusion:    Consultants (if any):   Discharged Condition: Improved  Hospital Course: Barbara Underwood is an 66 y.o. female who was admitted 10/11/2022 with a diagnosis of left knee osteoarthritis and went to the operating room on 10/11/2022 and underwent left total knee arthoplasty. The patient received perioperative antibiotics for prophylaxis (see below). The patient tolerated the procedure well and was transported to PACU in stable condition. After meeting PACU criteria, the patient was subsequently transferred to the Orthopaedics/Rehabilitation unit.   The patient received DVT prophylaxis in the form of early mobilization, Lovenox, TED hose, and SCDs . A sacral pad had been placed and heels were elevated off of the bed with rolled towels in order to protect skin integrity. Foley catheter was discontinued on postoperative day #0. Wound drains were discontinued on postoperative day #1. The surgical incision was healing well without signs of infection.  Physical therapy was initiated postoperatively for transfers, gait training, and strengthening. Occupational therapy was initiated for activities of daily living and evaluation for assisted devices. Rehabilitation goals were reviewed in detail with the patient. The patient made steady progress with physical therapy and physical therapy recommended discharge to Home.   The patient achieved the preliminary goals of this hospitalization and was felt to be medically and orthopaedically appropriate for discharge.  She was given perioperative antibiotics:  Anti-infectives (From admission, onward)    Start     Dose/Rate Route Frequency Ordered Stop   10/11/22 1500  ceFAZolin (ANCEF) IVPB 2g/100 mL premix        2 g  200 mL/hr over 30 Minutes Intravenous Every 6 hours 10/11/22 1301 10/12/22 0905   10/11/22 0643  ceFAZolin (ANCEF) 2-4 GM/100ML-% IVPB       Note to  Pharmacy: Josephina Shih A: cabinet override      10/11/22 0643 10/12/22 0904   10/11/22 0600  ceFAZolin (ANCEF) IVPB 2g/100 mL premix        2 g 200 mL/hr over 30 Minutes Intravenous On call to O.R. 10/11/22 0981 10/11/22 1914     .  Recent vital signs:  Vitals:   10/12/22 0518 10/12/22 1011  BP: 130/64 (!) 141/70  Pulse: 83 79  Resp: 17 18  Temp: 98.1 F (36.7 C) 98.1 F (36.7 C)  SpO2: 99% 99%    Recent laboratory studies:  No results for input(s): "WBC", "HGB", "HCT", "PLT", "K", "CL", "CO2", "BUN", "CREATININE", "GLUCOSE", "CALCIUM", "LABPT", "INR" in the last 72 hours.  Diagnostic Studies: DG Knee Left Port  Result Date: 10/11/2022 CLINICAL DATA:  Left knee surgery EXAM: PORTABLE LEFT KNEE - 1-2 VIEW COMPARISON:  02/07/2018 FINDINGS: Interval postsurgical changes from left total knee arthroplasty. Arthroplasty components are in their expected alignment. No periprosthetic fracture or evidence of other complication. Expected postoperative changes within the overlying soft tissues. IMPRESSION: Interval left total knee arthroplasty without evidence of postoperative complication. Electronically Signed   By: Davina Poke D.O.   On: 10/11/2022 12:10    Discharge Medications:   Allergies as of 10/12/2022       Reactions   Prednisone Nausea And Vomiting   headache   Codeine Nausea And Vomiting   Also dizziness        Medication List     TAKE these medications    buPROPion 300 MG 24 hr tablet Commonly known as: WELLBUTRIN XL Take 1 tablet (300 mg total) by mouth daily. What changed:  how much to take when to take this   celecoxib 200 MG capsule Commonly known as: CELEBREX Take 1 capsule (200 mg total) by mouth 2 (two) times daily.   citalopram 10 MG tablet Commonly known as: CELEXA Take 10 mg by mouth daily.   docusate sodium 100 MG capsule Commonly known as: Colace Take 1 capsule (100 mg total) by mouth 2 (two) times daily.   DULoxetine 60 MG  capsule Commonly known as: CYMBALTA Take 60 mg by mouth daily.   enoxaparin 40 MG/0.4ML injection Commonly known as: LOVENOX Inject 0.4 mLs (40 mg total) into the skin daily for 14 days.   levothyroxine 88 MCG tablet Commonly known as: SYNTHROID Take 88 mcg by mouth every morning.   multivitamin capsule Take 1 capsule by mouth daily.   oxyCODONE 5 MG immediate release tablet Commonly known as: Oxy IR/ROXICODONE Take 1 tablet (5 mg total) by mouth every 4 (four) hours as needed for severe pain.   traMADol 50 MG tablet Commonly known as: ULTRAM Take 1 tablet (50 mg total) by mouth every 4 (four) hours as needed for moderate pain.   traZODone 50 MG tablet Commonly known as: DESYREL Take by mouth.               Durable Medical Equipment  (From admission, onward)           Start     Ordered   10/11/22 1302  DME Walker rolling  Once       Question:  Patient needs a walker to treat with the following condition  Answer:  Total knee replacement status   10/11/22 1301  10/11/22 1302  DME Bedside commode  Once       Question:  Patient needs a bedside commode to treat with the following condition  Answer:  Total knee replacement status   10/11/22 1301            Disposition: Home with home health PT     Follow-up Information     Fausto Skillern, PA-C Follow up on 10/26/2022.   Specialty: Orthopedic Surgery Why: at 1:15pm Contact information: [place] [address] [phone]         Dereck Leep, MD Follow up on 11/23/2022.   Specialty: Orthopedic Surgery Why: at 3:00pm Contact information: [address] [place] [id number] [place], PA-C 10/12/2022, 2:57 PM

## 2022-10-12 NOTE — Evaluation (Signed)
Occupational Therapy Evaluation Patient Details Name: Barbara Underwood MRN: [id number] DOB: [birth year] Today's Date: 10/12/2022   History of Present Illness 66 y.o. female s/p left TKA. PMH includes hypothyroidism, sleep apnea.   Clinical Impression   Pt was seen for OT evaluation this date. Prior to hospital admission, pt was IND for I/ADLs. Pt lives with spouse. Pt presents to acute OT demonstrating impaired ADL performance and functional mobility 2/2 decreased activity tolerance and functional balance deficits. Pt currently requires grossly SUPERVISION for toilet t/f and standing grooming tasks. MIN cues for dressing for adapted techniques. Pt instructed in polar care mgt, falls prevention strategies, home/routines modifications, DME/AE for LB bathing/dressing tasks, and compression stocking mgt. All education complete, will sign off. Upon hospital discharge, recommend no OT follow up.    Recommendations for follow up therapy are one component of a multi-disciplinary discharge planning process, led by the attending physician.  Recommendations may be updated based on patient status, additional functional criteria and insurance authorization.   Follow Up Recommendations  No OT follow up    Assistance Recommended at Discharge Set up Supervision/Assistance  Patient can return home with the following A little help with bathing/dressing/bathroom;Help with stairs or ramp for entrance    Functional Status Assessment  Patient has had a recent decline in their functional status and demonstrates the ability to make significant improvements in function in a reasonable and predictable amount of time.  Equipment Recommendations  None recommended by OT    Recommendations for Other Services       Precautions / Restrictions Precautions Precautions: Knee;Fall Restrictions Weight Bearing Restrictions: Yes LLE Weight Bearing: Weight bearing as tolerated      Mobility Bed Mobility Overal bed  mobility: Needs Assistance Bed Mobility: Supine to Sit     Supine to sit: Supervision          Transfers Overall transfer level: Needs assistance Equipment used: Rolling walker (2 wheels) Transfers: Sit to/from Stand Sit to Stand: Supervision                  Balance Overall balance assessment: Needs assistance Sitting-balance support: No upper extremity supported, Feet supported Sitting balance-Leahy Scale: Normal     Standing balance support: No upper extremity supported, During functional activity Standing balance-Leahy Scale: Good Standing balance comment: inside BOS                           ADL either performed or assessed with clinical judgement   ADL Overall ADL's : Needs assistance/impaired                                       General ADL Comments: SUPERVISION + MIN cues+ RW don underwear, B socks, and pants at EOB. SUPERVISION + RW for toilet t/f and hand washing standing sink side.      Pertinent Vitals/Pain Pain Assessment Pain Assessment: 0-10 Pain Score: 7  (5 at rest) Pain Location: L knee Pain Descriptors / Indicators: Aching, Discomfort, Dull, Grimacing Pain Intervention(s): Limited activity within patient's tolerance, RN gave pain meds during session     Hand Dominance Right   Extremity/Trunk Assessment Upper Extremity Assessment Upper Extremity Assessment: Overall WFL for tasks assessed   Lower Extremity Assessment Lower Extremity Assessment: Overall WFL for tasks assessed       Communication Communication Communication: No difficulties   Cognition Arousal/Alertness: Awake/alert Behavior  During Therapy: WFL for tasks assessed/performed Overall Cognitive Status: Within Functional Limits for tasks assessed                                        Home Living Family/patient expects to be discharged to:: Private residence Living Arrangements: Spouse/significant other Available Help at  Discharge: Family Type of Home: House Home Access: Stairs to enter Technical brewer of Steps: 3 Entrance Stairs-Rails: Can reach both [place]: One level     Bathroom Shower/Tub: Occupational psychologist: Handicapped height Bathroom Accessibility: Yes   Home Equipment: [place] - single point;Shower seat - built in          Prior Functioning/Environment Prior Level of Function : Independent/Modified Independent                        OT Problem List: Decreased strength;Decreased activity tolerance;Decreased range of motion;Impaired balance (sitting and/or standing)         OT Goals(Current goals can be found in the care plan section) Acute Rehab OT Goals Patient Stated Goal: to go home OT Goal Formulation: With patient Time For Goal Achievement: 10/26/22 Potential to Achieve Goals: Good   AM-PAC OT "6 Clicks" Daily Activity     Outcome Measure Help from another person eating meals?: None Help from another person taking care of personal grooming?: A Little Help from another person toileting, which includes using toliet, bedpan, or urinal?: A Little Help from another person bathing (including washing, rinsing, drying)?: A Little Help from another person to put on and taking off regular upper body clothing?: None Help from another person to put on and taking off regular lower body clothing?: A Little 6 Click Score: 20   End of Session    Activity Tolerance: Patient tolerated treatment well Patient left: in chair;with call bell/phone within reach  OT Visit Diagnosis: Muscle weakness (generalized) (M62.81)                Time: 2947-6546 OT Time Calculation (min): 22 min Charges:  OT General Charges $OT Visit: 1 Visit OT Evaluation $OT Eval Low Complexity: 1 Low OT Treatments $Self Care/Home Management : 8-22 mins  Dessie Coma, M.S. OTR/L  10/12/22, 9:35 AM  ascom [phone]

## 2022-10-12 NOTE — Progress Notes (Signed)
Met with the patient in the room She lives at home with her husband He will provide transportation She will get a RW and 3 in 1 delivered to the bedside by [employer] prior to DC, she does not have a preferance of DME companies [facility] is set up with Surgeon for [facility] prior to surgery

## 2022-10-12 NOTE — Plan of Care (Signed)

## 2022-10-12 NOTE — Progress Notes (Signed)
Patient is not able to walk the distance required to go the bathroom, or he/she is unable to safely negotiate stairs required to access the bathroom.  A 3in1 BSC will alleviate this problem  

## 2022-10-12 NOTE — Progress Notes (Signed)
  Subjective: 1 Day Post-Op Procedure(s) (LRB): TOTAL KNEE ARTHROPLASTY (Left) Patient reports pain as moderate.   Patient is well, and has had no acute complaints or problems Plan is to go Home after hospital stay. Negative for chest pain and shortness of breath Fever: no Gastrointestinal: negative for nausea and vomiting.   Patient has not had a bowel movement.  Objective: Vital signs in last 24 hours: Temp:  [97.7 F (36.5 C)-98.3 F (36.8 C)] 98.1 F (36.7 C) (11/02 1011) Pulse Rate:  [63-89] 79 (11/02 1011) Resp:  [14-18] 18 (11/02 1011) BP: (101-152)/(61-76) 141/70 (11/02 1011) SpO2:  [95 %-99 %] 99 % (11/02 1011)  Intake/Output from previous day:  Intake/Output Summary (Last 24 hours) at 10/12/2022 1157 Last data filed at 10/12/2022 1144 Gross per 24 hour  Intake 3111.22 ml  Output 1515 ml  Net 1596.22 ml    Intake/Output this shift: Total I/O In: 755 [P.O.:240; I.V.:515] Out: 200 [Urine:200]  Labs: No results for input(s): "HGB" in the last 72 hours. No results for input(s): "WBC", "RBC", "HCT", "PLT" in the last 72 hours. No results for input(s): "NA", "K", "CL", "CO2", "BUN", "CREATININE", "GLUCOSE", "CALCIUM" in the last 72 hours. No results for input(s): "LABPT", "INR" in the last 72 hours.   EXAM General - Patient is Alert, Appropriate, and Oriented Extremity - Neurovascular intact Dorsiflexion/Plantar flexion intact Compartment soft Dressing/Incision -Postoperative dressing remains in place., Polar Care in place and working. , Hemovac in place. , Following removal of post-op dressing, no drainage  Motor Function - intact, moving foot and toes well on exam.    Cardiovascular- Regular rate and rhythm, no murmurs/rubs/gallops Respiratory- Lungs clear to auscultation bilaterally Gastrointestinal- soft, nontender, and active bowel sounds   Assessment/Plan: 1 Day Post-Op Procedure(s) (LRB): TOTAL KNEE ARTHROPLASTY (Left) Principal Problem:   Total knee  replacement status  Estimated body mass index is 35 kg/m as calculated from the following:   Height as of this encounter: '5\' 5"'$  (1.651 m).   Weight as of this encounter: 95.4 kg. Advance diet Up with therapy  Anticipate d/c later today pending completion of PT goals   Post-op dressing removed. , Hemovac removed., and Mini compression dressing applied.   DVT Prophylaxis - Lovenox, Ted hose, and SCDs Weight-Bearing as tolerated to left leg  Cassell Smiles, PA-C [facility] Orthopaedic Surgery 10/12/2022, 11:57 AM

## 2022-12-19 IMAGING — MG MM DIGITAL SCREENING BILAT W/ TOMO AND CAD
8 series · 8 of 24 positions shown · non-contrast
Comparison: Previous exam(s).

CLINICAL DATA: Screening.

EXAM:
DIGITAL SCREENING BILATERAL MAMMOGRAM WITH TOMOSYNTHESIS AND CAD
TECHNIQUE: Bilateral screening digital craniocaudal and mediolateral oblique
mammograms were obtained. Bilateral screening digital breast
tomosynthesis was performed. The images were evaluated with
computer-aided detection.

[L MLO synth-2D]
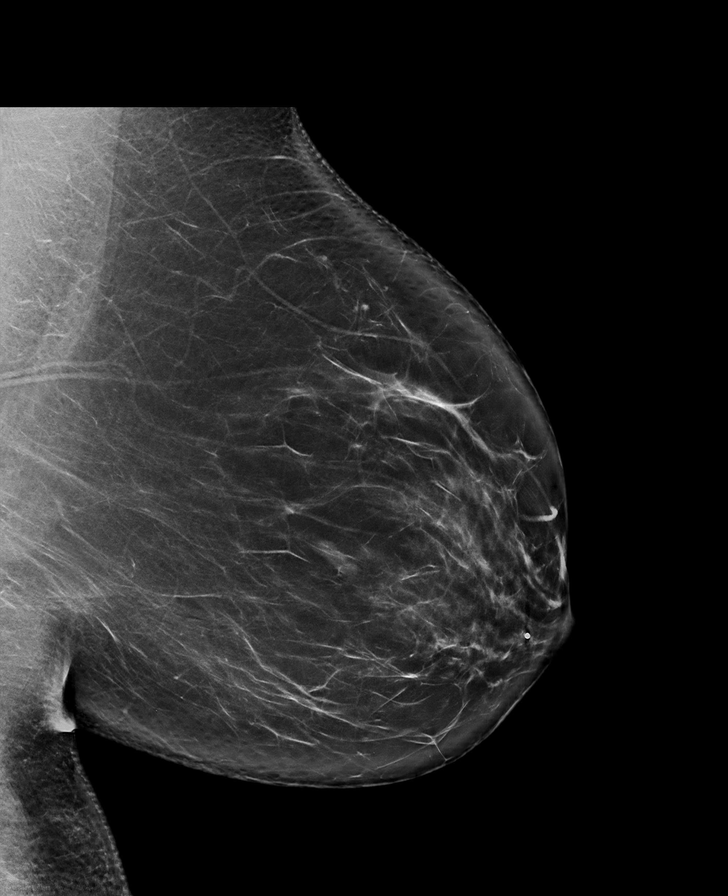

[R CC synth-2D]
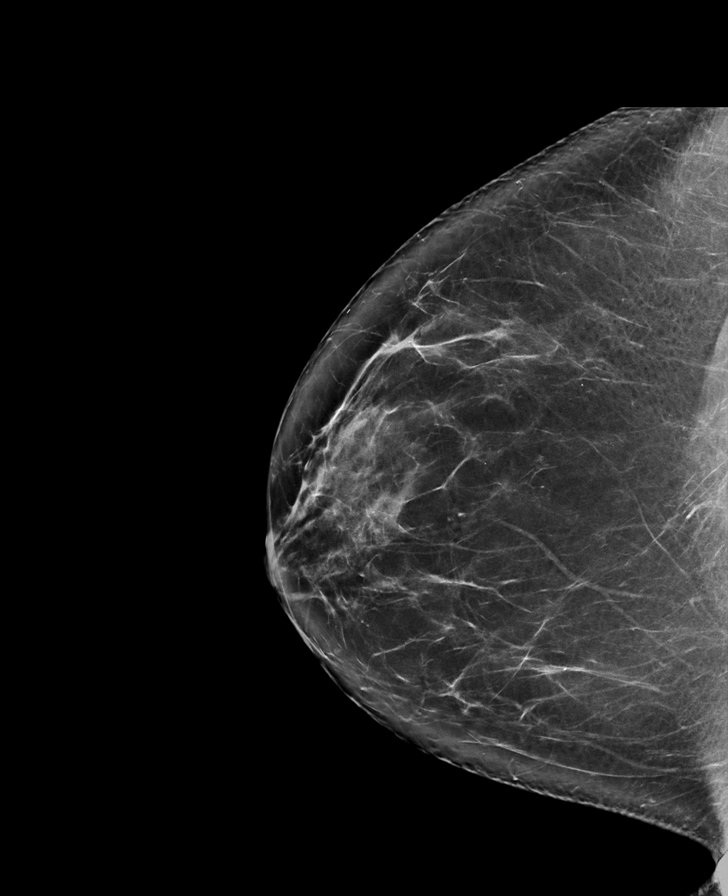

[R MLO synth-2D]
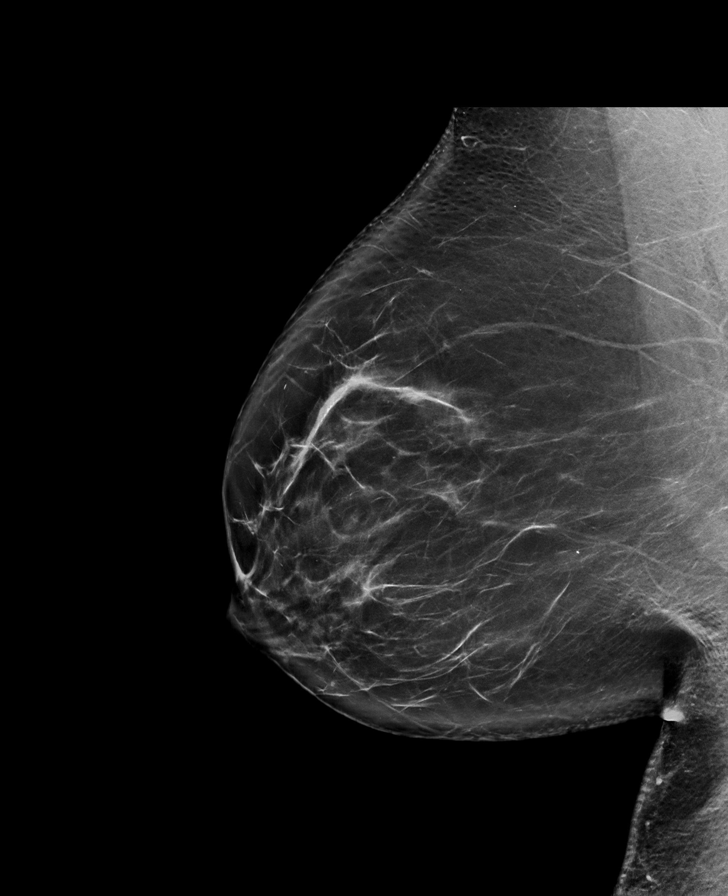

[L CC synth-2D]
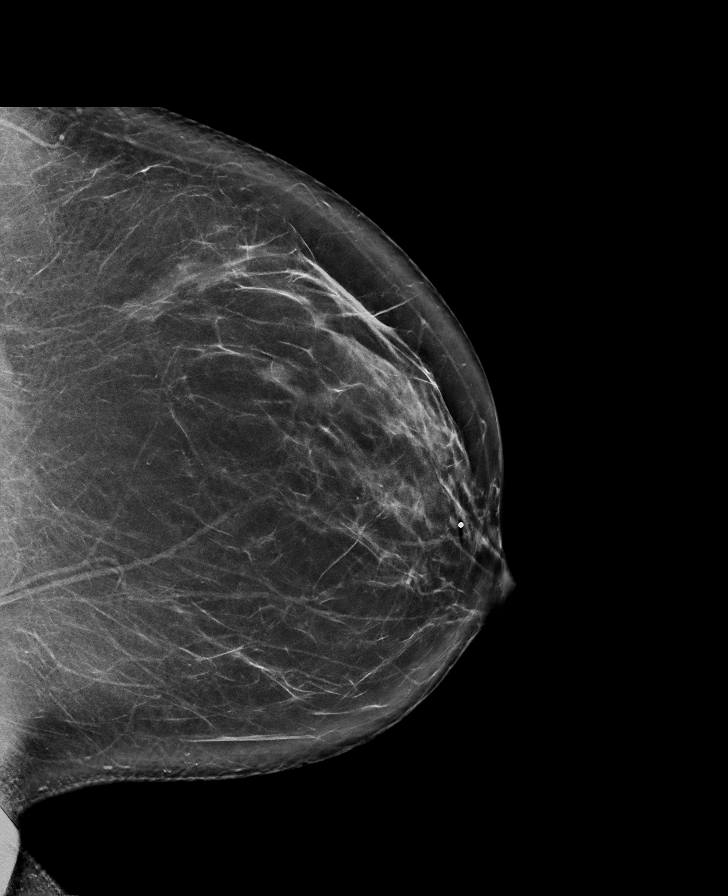

[L CC tomo · tomo slice 44/87.0]
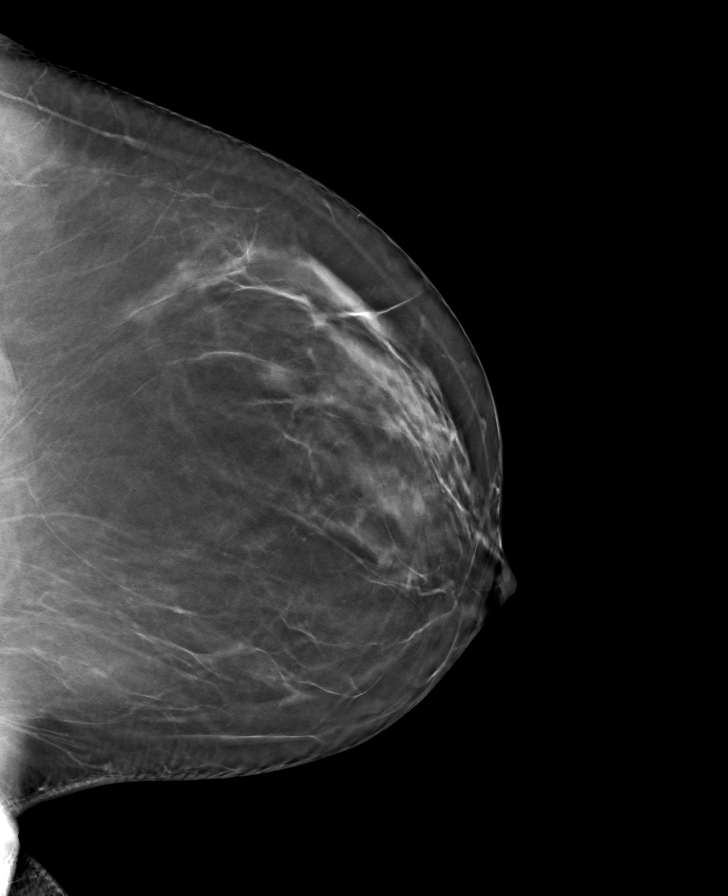

[L MLO tomo · tomo slice 47/94.0]
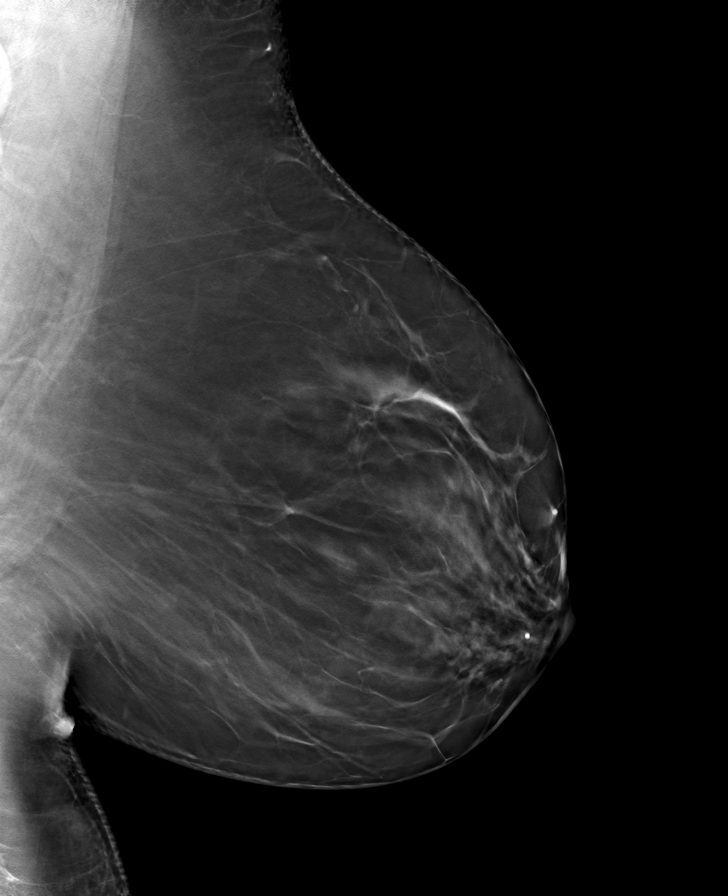

[R CC tomo · tomo slice 43/85.0]
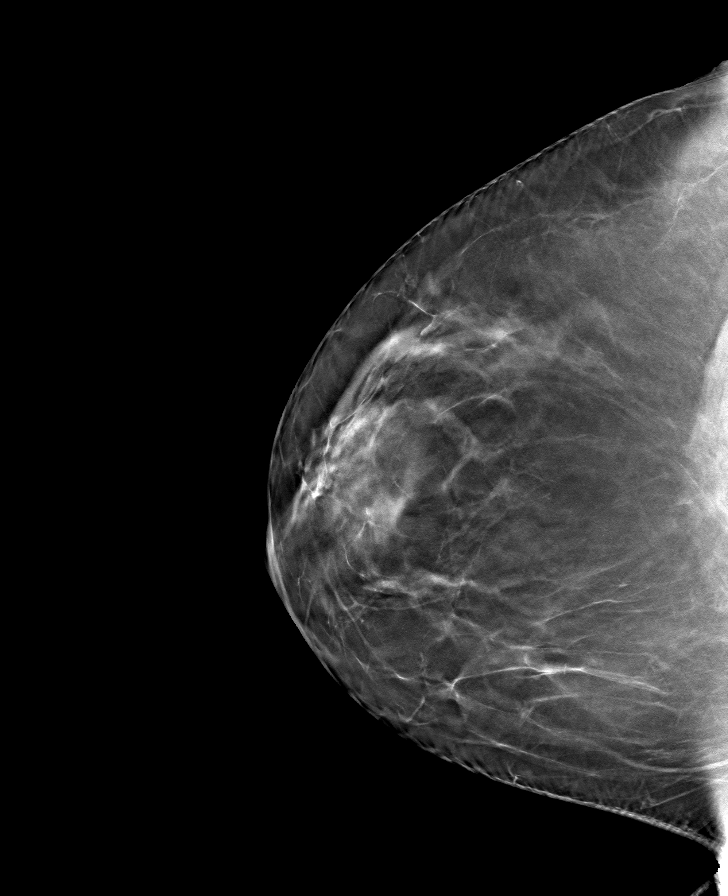

[R MLO tomo · tomo slice 47/93.0]
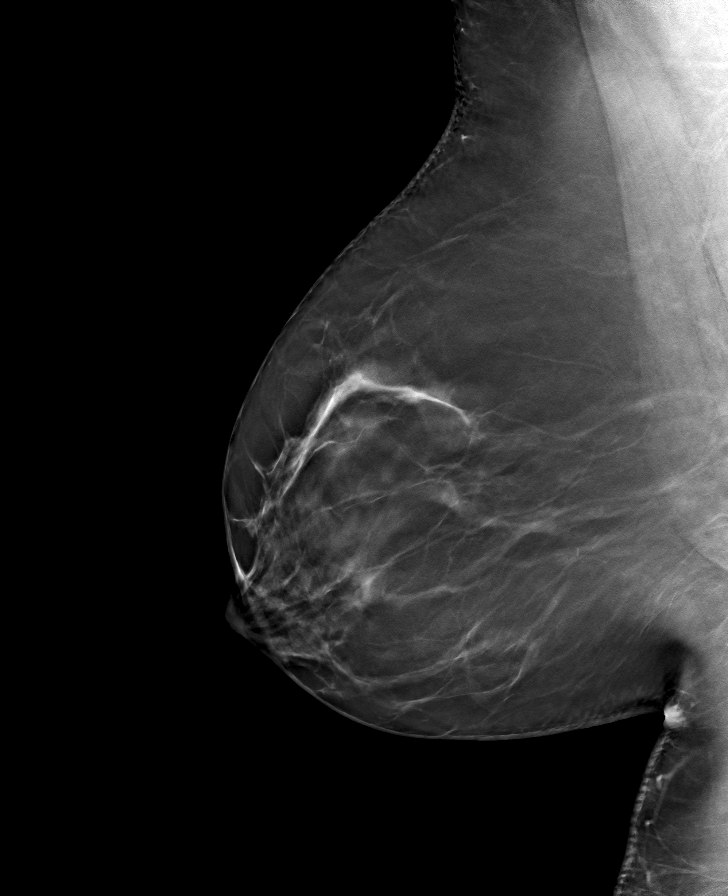

[8 of 24 positions shown; findings below may reference images not displayed]

ACR Breast Density Category b: There are scattered areas of
fibroglandular density.
FINDINGS: There are no findings suspicious for malignancy.
IMPRESSION: No mammographic evidence of malignancy. A result letter of this
screening mammogram will be mailed directly to the patient.

RECOMMENDATION:
Screening mammogram in one year. (Code:51-O-LD2)

BI-RADS CATEGORY  1: Negative.

## 2023-04-30 ENCOUNTER — Other Ambulatory Visit: Payer: Self-pay | Admitting: Internal Medicine

## 2023-04-30 DIAGNOSIS — Z1231 Encounter for screening mammogram for malignant neoplasm of breast: Secondary | ICD-10-CM

## 2023-05-02 ENCOUNTER — Encounter: Payer: Self-pay | Admitting: Internal Medicine

## 2023-05-02 ENCOUNTER — Other Ambulatory Visit: Payer: Self-pay | Admitting: Internal Medicine

## 2023-05-02 DIAGNOSIS — E7800 Pure hypercholesterolemia, unspecified: Secondary | ICD-10-CM

## 2023-05-02 DIAGNOSIS — E039 Hypothyroidism, unspecified: Secondary | ICD-10-CM

## 2023-05-02 DIAGNOSIS — R7303 Prediabetes: Secondary | ICD-10-CM

## 2023-06-05 ENCOUNTER — Other Ambulatory Visit: Payer: Medicare Other

## 2023-06-05 ENCOUNTER — Ambulatory Visit
Admission: RE | Admit: 2023-06-05 | Discharge: 2023-06-05 | Disposition: A | Discharge status: Home / Self Care | Payer: Medicare Other | Source: Ambulatory Visit | Attending: Internal Medicine | Admitting: Internal Medicine

## 2023-06-05 DIAGNOSIS — E7800 Pure hypercholesterolemia, unspecified: Secondary | ICD-10-CM | POA: Diagnosis present

## 2023-06-05 DIAGNOSIS — E039 Hypothyroidism, unspecified: Secondary | ICD-10-CM | POA: Diagnosis present

## 2023-06-05 DIAGNOSIS — R7303 Prediabetes: Secondary | ICD-10-CM | POA: Diagnosis present
